# Patient Record
Sex: Male | Born: 1975 | Race: White | Hispanic: No | Marital: Single | Smoking: Current every day smoker
Health system: Southern US, Community
[De-identification: ages and names within clinical notes are randomized; demographics above are authoritative.]

## PROBLEM LIST (undated history)

## (undated) DIAGNOSIS — I872 Venous insufficiency (chronic) (peripheral): Secondary | ICD-10-CM

## (undated) DIAGNOSIS — I10 Essential (primary) hypertension: Secondary | ICD-10-CM

## (undated) HISTORY — DX: Essential (primary) hypertension: I10

---

## 2008-03-09 ENCOUNTER — Ambulatory Visit: Payer: Self-pay | Admitting: Family Medicine

## 2012-06-07 ENCOUNTER — Emergency Department: Payer: Self-pay | Admitting: Emergency Medicine

## 2015-03-18 ENCOUNTER — Emergency Department: Payer: Self-pay | Admitting: Emergency Medicine
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
## 2021-08-16 DIAGNOSIS — Z20822 Contact with and (suspected) exposure to covid-19: Secondary | ICD-10-CM | POA: Diagnosis not present

## 2021-09-07 DIAGNOSIS — Z79899 Other long term (current) drug therapy: Secondary | ICD-10-CM | POA: Diagnosis not present

## 2021-09-07 DIAGNOSIS — F1120 Opioid dependence, uncomplicated: Secondary | ICD-10-CM | POA: Diagnosis not present

## 2021-09-07 DIAGNOSIS — Z1389 Encounter for screening for other disorder: Secondary | ICD-10-CM | POA: Diagnosis not present

## 2021-09-15 DIAGNOSIS — F1120 Opioid dependence, uncomplicated: Secondary | ICD-10-CM | POA: Diagnosis not present

## 2021-09-29 DIAGNOSIS — Z1389 Encounter for screening for other disorder: Secondary | ICD-10-CM | POA: Diagnosis not present

## 2021-09-29 DIAGNOSIS — F17290 Nicotine dependence, other tobacco product, uncomplicated: Secondary | ICD-10-CM | POA: Diagnosis not present

## 2021-09-29 DIAGNOSIS — F1120 Opioid dependence, uncomplicated: Secondary | ICD-10-CM | POA: Diagnosis not present

## 2021-09-29 DIAGNOSIS — Z79899 Other long term (current) drug therapy: Secondary | ICD-10-CM | POA: Diagnosis not present

## 2021-10-27 DIAGNOSIS — Z79899 Other long term (current) drug therapy: Secondary | ICD-10-CM | POA: Diagnosis not present

## 2021-10-27 DIAGNOSIS — Z1389 Encounter for screening for other disorder: Secondary | ICD-10-CM | POA: Diagnosis not present

## 2021-10-27 DIAGNOSIS — F1120 Opioid dependence, uncomplicated: Secondary | ICD-10-CM | POA: Diagnosis not present

## 2021-11-24 DIAGNOSIS — Z79899 Other long term (current) drug therapy: Secondary | ICD-10-CM | POA: Diagnosis not present

## 2021-11-24 DIAGNOSIS — F1120 Opioid dependence, uncomplicated: Secondary | ICD-10-CM | POA: Diagnosis not present

## 2021-12-07 DIAGNOSIS — F1120 Opioid dependence, uncomplicated: Secondary | ICD-10-CM | POA: Diagnosis not present

## 2021-12-07 DIAGNOSIS — Z1389 Encounter for screening for other disorder: Secondary | ICD-10-CM | POA: Diagnosis not present

## 2021-12-07 DIAGNOSIS — Z79899 Other long term (current) drug therapy: Secondary | ICD-10-CM | POA: Diagnosis not present

## 2021-12-26 ENCOUNTER — Encounter: Payer: Self-pay | Admitting: Family Medicine

## 2021-12-26 ENCOUNTER — Other Ambulatory Visit: Payer: Self-pay

## 2021-12-26 ENCOUNTER — Ambulatory Visit (INDEPENDENT_AMBULATORY_CARE_PROVIDER_SITE_OTHER): Payer: BC Managed Care – PPO | Admitting: Family Medicine

## 2021-12-26 VITALS — BP 147/84 | HR 89 | Ht 68.0 in | Wt 318.6 lb

## 2021-12-26 DIAGNOSIS — Z6841 Body Mass Index (BMI) 40.0 and over, adult: Secondary | ICD-10-CM

## 2021-12-26 DIAGNOSIS — I10 Essential (primary) hypertension: Secondary | ICD-10-CM | POA: Insufficient documentation

## 2021-12-26 DIAGNOSIS — Z7689 Persons encountering health services in other specified circumstances: Secondary | ICD-10-CM | POA: Diagnosis not present

## 2021-12-26 LAB — BASIC METABOLIC PANEL WITH GFR
BUN: 14 mg/dL (ref 7–25)
CO2: 32 mmol/L (ref 20–32)
Calcium: 9.2 mg/dL (ref 8.6–10.3)
Chloride: 104 mmol/L (ref 98–110)
Creat: 0.88 mg/dL (ref 0.60–1.29)
Glucose, Bld: 94 mg/dL (ref 65–139)
Potassium: 4.7 mmol/L (ref 3.5–5.3)
Sodium: 140 mmol/L (ref 135–146)
eGFR: 108 mL/min/{1.73_m2} (ref >=60)

## 2021-12-26 MED ORDER — AMLODIPINE BESYLATE 5 MG PO TABS: 5.0000 mg | ORAL_TABLET | Freq: Every day | ORAL | 2 refills | Status: DC

## 2021-12-26 NOTE — Patient Instructions (Addendum)
Thank you for coming to the office today.   Check food labels for sodium % - look for low sodium options.  Reduce the Soda down from 3 per day down to 1. Increase water intake.  Start with Amlodipine blood pressure pill 50m daily. Can cause some swelling in feet ankles legs, try to keep elevating them to help reduce.   Lab today for basic chemistry  Plan for future physical wellness with full labs in Summer 2023  Colon Cancer Screening: - For all adults age 46+routine colon cancer screening is highly recommended.     - Recent guidelines from [place] starting age of 495- Early detection of colon cancer is important, because often there are no warning signs or symptoms, also if found early usually it can be cured. Late stage is hard to treat.  - If you are not interested in Colonoscopy screening (if done and normal you could be cleared for 5 to 10 years until next due), then Cologuard is an excellent alternative for screening test for Colon Cancer. It is highly sensitive for detecting DNA of colon cancer from even the earliest stages. Also, there is NO bowel prep required. - If Cologuard is NEGATIVE, then it is good for 3 years before next due - If Cologuard is POSITIVE, then it is strongly advised to get a Colonoscopy, which allows the GI doctor to locate the source of the cancer or polyp (even very early stage) and treat it by removing it. ------------------------- If you would like to proceed with Cologuard (stool DNA test) - FIRST, call your insurance company and tell them you want to check cost of Cologuard tell them CPT Code [phone](it may be completely covered and you could get for no cost, OR max cost without any coverage is about $600). Also, keep in mind if you do NOT open the kit, and decide not to do the test, you will NOT be charged, you should contact the company if you decide not to do the test. - If you want to proceed, you can notify [place](phone message,  [place] or at next visit) and we will order it for you. The test kit will be delivered to you house within about 1 week. Follow instructions to collect sample, you may call the company for any help or questions, 24/7 telephone support at [phone]    Please schedule a Follow-up Appointment to: Return in about 6 weeks (around 02/06/2022) for 6 weeks follow-up Blood pressure HTN med.  If you have any other questions or concerns, please feel free to call the office or send a message through [place] You may also schedule an earlier appointment if necessary.  Additionally, you may be receiving a survey about your experience at our office within a few days to 1 week by e-mail or mail. We value your feedback.  ANobie Putnam DO [facility] DASH Eating Plan DASH stands for Dietary Approaches to Stop Hypertension. The DASH eating plan is a healthy eating plan that has been shown to: Reduce high blood pressure (hypertension). Reduce your risk for type 2 diabetes, heart disease, and stroke. Help with weight loss. What are tips for following this plan? Reading food labels Check food labels for the amount of salt (sodium) per serving. Choose foods with less than 5 percent of the Daily Value of sodium. Generally, foods with less than 300 milligrams (mg) of sodium per serving fit into this eating plan. To find  whole grains, look for the word "whole" as the first word in the ingredient list. Shopping Buy products labeled as "low-sodium" or "no salt added." Buy fresh foods. Avoid canned foods and pre-made or frozen meals. Cooking Avoid adding salt when cooking. Use salt-free seasonings or herbs instead of table salt or sea salt. Check with your health care provider or pharmacist before using salt substitutes. Do not fry foods. Cook foods using healthy methods such as baking, boiling, grilling, roasting, and broiling instead. Cook with heart-healthy oils, such  as olive, canola, avocado, soybean, or sunflower oil. Meal planning  Eat a balanced diet that includes: 4 or more servings of fruits and 4 or more servings of vegetables each day. Try to fill one-half of your plate with fruits and vegetables. 6-8 servings of whole grains each day. Less than 6 oz (170 g) of lean meat, poultry, or fish each day. A 3-oz (85-g) serving of meat is about the same size as a deck of cards. One egg equals 1 oz (28 g). 2-3 servings of low-fat dairy each day. One serving is 1 cup (237 mL). 1 serving of nuts, seeds, or beans 5 times each week. 2-3 servings of heart-healthy fats. Healthy fats called omega-3 fatty acids are found in foods such as walnuts, flaxseeds, fortified milks, and eggs. These fats are also found in cold-water fish, such as sardines, salmon, and mackerel. Limit how much you eat of: Canned or prepackaged foods. Food that is high in trans fat, such as some fried foods. Food that is high in saturated fat, such as fatty meat. Desserts and other sweets, sugary drinks, and other foods with added sugar. Full-fat dairy products. Do not salt foods before eating. Do not eat more than 4 egg yolks a week. Try to eat at least 2 vegetarian meals a week. Eat more home-cooked food and less restaurant, buffet, and fast food. Lifestyle When eating at a restaurant, ask that your food be prepared with less salt or no salt, if possible. If you drink alcohol: Limit how much you use to: 0-1 drink a day for women who are not pregnant. 0-2 drinks a day for men. Be aware of how much alcohol is in your drink. In the [place] one drink equals one 12 oz bottle of beer (355 mL), one 5 oz glass of wine (148 mL), or one 1 oz glass of hard liquor (44 mL). General information Avoid eating more than 2,300 mg of salt a day. If you have hypertension, you may need to reduce your sodium intake to 1,500 mg a day. Work with your health care provider to maintain a healthy body weight or to  lose weight. Ask what an ideal weight is for you. Get at least 30 minutes of exercise that causes your heart to beat faster (aerobic exercise) most days of the week. Activities may include walking, swimming, or biking. Work with your health care provider or dietitian to adjust your eating plan to your individual calorie needs. What foods should I eat? Fruits All fresh, dried, or frozen fruit. Canned fruit in natural juice (without added sugar). Vegetables Fresh or frozen vegetables (raw, steamed, roasted, or grilled). Low-sodium or reduced-sodium tomato and vegetable juice. Low-sodium or reduced-sodium tomato sauce and tomato paste. Low-sodium or reduced-sodium canned vegetables. Grains Whole-grain or whole-wheat bread. Whole-grain or whole-wheat pasta. Brown rice. Brad [name]. Bulgur. Whole-grain and low-sodium cereals. Pita bread. Low-fat, low-sodium crackers. Whole-wheat flour tortillas. Meats and other proteins Skinless chicken or [place]. Ground chicken or [place].  Pork with fat trimmed off. Fish and seafood. Egg whites. Dried beans, peas, or lentils. Unsalted nuts, nut butters, and seeds. Unsalted canned beans. Lean cuts of beef with fat trimmed off. Low-sodium, lean precooked or cured meat, such as sausages or meat loaves. Dairy Low-fat (1%) or fat-free (skim) milk. Reduced-fat, low-fat, or fat-free cheeses. Nonfat, low-sodium ricotta or cottage cheese. Low-fat or nonfat yogurt. Low-fat, low-sodium cheese. Fats and oils Soft margarine without trans fats. Vegetable oil. Reduced-fat, low-fat, or light mayonnaise and salad dressings (reduced-sodium). Canola, safflower, olive, avocado, soybean, and sunflower oils. Avocado. Seasonings and condiments Herbs. Spices. Seasoning mixes without salt. Other foods Unsalted popcorn and pretzels. Fat-free sweets. The items listed above may not be a complete list of foods and beverages you can eat. Contact a dietitian for more information. What foods  should I avoid? Fruits Canned fruit in a light or heavy syrup. Fried fruit. Fruit in cream or butter sauce. Vegetables Creamed or fried vegetables. Vegetables in a cheese sauce. Regular canned vegetables (not low-sodium or reduced-sodium). Regular canned tomato sauce and paste (not low-sodium or reduced-sodium). Regular tomato and vegetable juice (not low-sodium or reduced-sodium). Brad [name]. Olives. Grains Baked goods made with fat, such as croissants, muffins, or some breads. Dry pasta or rice meal packs. Meats and other proteins Fatty cuts of meat. Ribs. Fried meat. Brad [name]. Bologna, salami, and other precooked or cured meats, such as sausages or meat loaves. Fat from the back of a pig (fatback). Bratwurst. Salted nuts and seeds. Canned beans with added salt. Canned or smoked fish. Whole eggs or egg yolks. Chicken or [place] with skin. Dairy Whole or 2% milk, cream, and half-and-half. Whole or full-fat cream cheese. Whole-fat or sweetened yogurt. Full-fat cheese. Nondairy creamers. Whipped toppings. Processed cheese and cheese spreads. Fats and oils Butter. Stick margarine. Lard. Shortening. Ghee. Bacon fat. Tropical oils, such as coconut, palm kernel, or palm oil. Seasonings and condiments Onion salt, garlic salt, seasoned salt, table salt, and sea salt. Worcestershire sauce. Tartar sauce. Barbecue sauce. Teriyaki sauce. Soy sauce, including reduced-sodium. Steak sauce. Canned and packaged gravies. Fish sauce. Oyster sauce. Cocktail sauce. Store-bought horseradish. Ketchup. Mustard. Meat flavorings and tenderizers. Bouillon cubes. Hot sauces. Pre-made or packaged marinades. Pre-made or packaged taco seasonings. Relishes. Regular salad dressings. Other foods Salted popcorn and pretzels. The items listed above may not be a complete list of foods and beverages you should avoid. Contact a dietitian for more information. Where to find more information National Heart, Lung, and Blood Institute:  https://wilson-eaton.com/ American Heart Association: www.heart.org Academy of Nutrition and Dietetics: www.eatright.[place]: www.kidney.org Summary The DASH eating plan is a healthy eating plan that has been shown to reduce high blood pressure (hypertension). It may also reduce your risk for type 2 diabetes, heart disease, and stroke. When on the DASH eating plan, aim to eat more fresh fruits and vegetables, whole grains, lean proteins, low-fat dairy, and heart-healthy fats. With the DASH eating plan, you should limit salt (sodium) intake to 2,300 mg a day. If you have hypertension, you may need to reduce your sodium intake to 1,500 mg a day. Work with your health care provider or dietitian to adjust your eating plan to your individual calorie needs. This information is not intended to replace advice given to you by your health care provider. Make sure you discuss any questions you have with your health care provider. Document Revised: 11/13/2019 Document Reviewed: 11/13/2019 Elsevier Patient Education  2022 [employer].

## 2021-12-26 NOTE — Progress Notes (Signed)
Subjective:    Patient ID: Brad [name], male    DOB: 1976-08-05, 46 y.o.   MRN: [id number]  Brad [name] is a 46 y.o. male presenting on 12/26/2021 for Establish Care and Hypertension   HPI  CHRONIC HTN Morbid Obesity BMI >48 Reports history of HTN, not established with prior doctor, has not been on medication.  Current Meds - None never on before. Lifestyle: - Diet: Increased fruit for breakfast, admits limited time during lunch usually convenience or fast foods. Admits drinks frequent soda 3 x 20 oz Admits occasional edema in legs, if improved w/ active and if sleeping in bed it improve Denies CP, dyspnea, HA, dizziness / lightheadedness   Health Maintenance:  Due for Colon / Prostate CA Screening.  Depression screen PHQ 2/9 12/26/2021  Decreased Interest 0  Down, Depressed, Hopeless 0  PHQ - 2 Score 0    Past Medical History:  Diagnosis Date   Hypertension    History reviewed. No pertinent surgical history. Social History   Socioeconomic History   Marital status: Single    Spouse name: Not on file   Number of children: Not on file   Years of education: Not on file   Highest education level: Not on file  Occupational History   Not on file  Tobacco Use   Smoking status: Some Days    Packs/day: 1.00    Years: 27.00    Pack years: 27.00    Types: Cigarettes   Smokeless tobacco: Never  Vaping Use   Vaping Use: Never used  Substance and Sexual Activity   Alcohol use: Yes    Alcohol/week: 1.0 - 2.0 standard drink    Types: 1 - 2 Standard drinks or equivalent per week   Drug use: Yes    Types: Marijuana   Sexual activity: Not on file  Other Topics Concern   Not on file  Social History Narrative   Not on file   Social Determinants of Health   Financial Resource Strain: Not on file  Food Insecurity: Not on file  Transportation Needs: Not on file  Physical Activity: Not on file  Stress: Not on file  Social Connections: Not on file   Intimate Partner Violence: Not on file   Family History  Problem Relation Age of Onset   Leukemia Maternal Grandmother    Stroke Maternal Grandfather    No current outpatient medications on file prior to visit.   No current facility-administered medications on file prior to visit.    Review of Systems Per HPI unless specifically indicated above     Objective:    BP (!) 147/84    Pulse 89    Ht 5\' 8"  (1.727 m)    Wt (!) 318 lb 9.6 oz (144.5 kg)    SpO2 98%    BMI 48.44 kg/m   Wt Readings from Last 3 Encounters:  12/26/21 (!) 318 lb 9.6 oz (144.5 kg)    Physical Exam Vitals and nursing note reviewed.  Constitutional:      General: He is not in acute distress.    Appearance: He is well-developed. He is not diaphoretic.     Comments: Well-appearing, comfortable, cooperative  HENT:     Head: Normocephalic and atraumatic.  Eyes:     General:        Right eye: No discharge.        Left eye: No discharge.     Conjunctiva/sclera: Conjunctivae normal.  Neck:  Thyroid: No thyromegaly.  Cardiovascular:     Rate and Rhythm: Normal rate and regular rhythm.     Pulses: Normal pulses.     Heart sounds: Normal heart sounds. No murmur heard. Pulmonary:     Effort: Pulmonary effort is normal. No respiratory distress.     Breath sounds: Normal breath sounds. No wheezing or rales.  Musculoskeletal:        General: Normal range of motion.     Cervical back: Normal range of motion and neck supple.  Lymphadenopathy:     Cervical: No cervical adenopathy.  Skin:    General: Skin is warm and dry.     Findings: No erythema or rash.  Neurological:     Mental Status: He is alert and oriented to person, place, and time. Mental status is at baseline.  Psychiatric:        Behavior: Behavior normal.     Comments: Well groomed, good eye contact, normal speech and thoughts   No results found for this or any previous visit.    Assessment & Plan:   Problem List Items Addressed This  Visit     Morbid obesity with BMI of 45.0-49.9, adult ([place])   Essential hypertension - Primary    Mildly elevated initial BP, prior history uncontrolled HTN No prior treatments - Home BP readings / mild elevated  No known complications    Plan:  1. Start rx Amlodipine 5mg  daily, review benefits potential side effect dosing 2. Encourage improved lifestyle - low sodium diet, regular exercise 3. Continue monitor BP outside office, bring readings to next visit, if persistently >140/90 or new symptoms notify office sooner  Labs today Follow-up 6 weeks      Relevant Medications   amLODipine (NORVASC) 5 MG tablet   Other Relevant Orders   BASIC METABOLIC PANEL WITH GFR   Other Visit Diagnoses     Encounter to establish care with new doctor           Meds ordered this encounter  Medications   amLODipine (NORVASC) 5 MG tablet    Sig: Take 1 tablet (5 mg total) by mouth daily.    Dispense:  30 tablet    Refill:  2   Lab today for basic chemistry  Plan for future physical wellness with full labs in Summer 2023  [place] handout   Follow up plan: Return in about 6 weeks (around 02/06/2022) for 6 weeks follow-up Blood pressure HTN med.  Nobie Putnam, [facility] 12/26/2021, 10:44 AM

## 2021-12-26 NOTE — Assessment & Plan Note (Signed)
Mildly elevated initial BP, prior history uncontrolled HTN No prior treatments - Home BP readings / mild elevated  No known complications    Plan:  1. Start rx Amlodipine 5mg  daily, review benefits potential side effect dosing 2. Encourage improved lifestyle - low sodium diet, regular exercise 3. Continue monitor BP outside office, bring readings to next visit, if persistently >140/90 or new symptoms notify office sooner  Labs today Follow-up 6 weeks

## 2022-01-04 DIAGNOSIS — Z79899 Other long term (current) drug therapy: Secondary | ICD-10-CM | POA: Diagnosis not present

## 2022-01-04 DIAGNOSIS — F909 Attention-deficit hyperactivity disorder, unspecified type: Secondary | ICD-10-CM | POA: Diagnosis not present

## 2022-01-04 DIAGNOSIS — F1120 Opioid dependence, uncomplicated: Secondary | ICD-10-CM | POA: Diagnosis not present

## 2022-02-01 DIAGNOSIS — F909 Attention-deficit hyperactivity disorder, unspecified type: Secondary | ICD-10-CM | POA: Diagnosis not present

## 2022-02-01 DIAGNOSIS — Z79899 Other long term (current) drug therapy: Secondary | ICD-10-CM | POA: Diagnosis not present

## 2022-02-01 DIAGNOSIS — Z1389 Encounter for screening for other disorder: Secondary | ICD-10-CM | POA: Diagnosis not present

## 2022-02-01 DIAGNOSIS — F1120 Opioid dependence, uncomplicated: Secondary | ICD-10-CM | POA: Diagnosis not present

## 2022-02-13 ENCOUNTER — Other Ambulatory Visit: Payer: Self-pay

## 2022-02-13 ENCOUNTER — Ambulatory Visit (INDEPENDENT_AMBULATORY_CARE_PROVIDER_SITE_OTHER): Payer: BC Managed Care – PPO | Admitting: Family Medicine

## 2022-02-13 VITALS — BP 160/86 | HR 88 | Ht 68.0 in | Wt 329.4 lb

## 2022-02-13 DIAGNOSIS — Z6841 Body Mass Index (BMI) 40.0 and over, adult: Secondary | ICD-10-CM

## 2022-02-13 DIAGNOSIS — I872 Venous insufficiency (chronic) (peripheral): Secondary | ICD-10-CM | POA: Insufficient documentation

## 2022-02-13 DIAGNOSIS — Z1211 Encounter for screening for malignant neoplasm of colon: Secondary | ICD-10-CM

## 2022-02-13 DIAGNOSIS — L989 Disorder of the skin and subcutaneous tissue, unspecified: Secondary | ICD-10-CM

## 2022-02-13 DIAGNOSIS — I10 Essential (primary) hypertension: Secondary | ICD-10-CM | POA: Diagnosis not present

## 2022-02-13 DIAGNOSIS — R600 Localized edema: Secondary | ICD-10-CM

## 2022-02-13 MED ORDER — FUROSEMIDE 20 MG PO TABS: 20.0000 mg | ORAL_TABLET | Freq: Every day | ORAL | 2 refills | Status: DC | PRN

## 2022-02-13 MED ORDER — HYDROCHLOROTHIAZIDE 25 MG PO TABS: 25.0000 mg | ORAL_TABLET | Freq: Every day | ORAL | 3 refills | Status: DC

## 2022-02-13 NOTE — Assessment & Plan Note (Signed)
Still elevated BP did not take med today  No home readings Side effect leg swelling worse w/ Amlodipine   Plan:  1. DC Amlodipine 5mg  2. START HCTZ 25mg  daily - Add Furosemide Lasix 20mg  daily PRN ONLY caution with too much use 3. Encourage improved lifestyle - low sodium diet, regular exercise 4. Continue monitor BP outside office, bring readings to next visit, if persistently >140/90 or new symptoms notify office sooner

## 2022-02-13 NOTE — Patient Instructions (Addendum)
Thank you for coming to the office today. ° °Stop Amlodipine °Start Hydrochlorothiazide HCTZ 25mg daily (fluid pill) ° °ONLY IF SWELLING is significant take Lasix (Furosemide) 20mg daily ONLY as needed for swelling can take for a few days in a row 3-5 then stop immediately. ° °Ordered the Cologuard (home kit) test for colon cancer screening. Stay tuned for further updates. ° °It will be shipped to you directly. If not received in 2-4 weeks, call us or the company. °  °If you send it back and no results are received in 2-4 weeks, call us or the company as well! °  °Colon Cancer Screening: °- For all adults age 50+ routine colon cancer screening is highly recommended. °    - Recent guidelines from American Cancer Society recommend starting age of 45 °- Early detection of colon cancer is important, because often there are no warning signs or symptoms, also if found early usually it can be cured. Late stage is hard to treat. °  °- If Cologuard is NEGATIVE, then it is good for 3 years before next due °- If Cologuard is POSITIVE, then it is strongly advised to get a Colonoscopy, which allows the GI doctor to locate the source of the cancer or polyp (even very early stage) and treat it by removing it. °------------------------- °Follow instructions to collect sample, you may call the company for any help or questions, 24/7 telephone support at [phone]. ° ° °Use RICE therapy: °- R - Rest / relative rest with activity modification avoid overuse of joint °- I - Ice packs (make sure you use a towel or sock / something to protect skin) °- C - Compression with stockings / ACE wrap to apply pressure and reduce swelling allowing more support °- E - Elevation - if significant swelling, lift leg above heart level (toes above your nose) to help reduce swelling, most helpful at night after day of being on your feet ° ° ° °Please schedule a Follow-up Appointment to: Return in about 3 months (around 05/13/2022) for 3 month  follow-up HTN, Edema. ° °If you have any other questions or concerns, please feel free to call the office or send a message through MyChart. You may also schedule an earlier appointment if necessary. ° °Additionally, you may be receiving a survey about your experience at our office within a few days to 1 week by e-mail or mail. We value your feedback. ° °Alexander Karamalegos, DO °[facility] °

## 2022-02-13 NOTE — Assessment & Plan Note (Signed)
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

## 2022-02-13 NOTE — Progress Notes (Signed)
Subjective:    Patient ID: Brad [name], male    DOB: Nov 07, 1976, 46 y.o.   MRN: [id number]  Brad [name] is a 46 y.o. male presenting on 02/13/2022 for Hypertension   HPI  CHRONIC HTN Morbid Obesity BMI >50 Venous Insufficiency ./ LE Edema Last visit 12/26/21 - started Amlodipine 27m daily but he had side effect of leg swelling. Not checking BP regularly at home, no access to cuff. No other readings. He said past few weeks leg swelling increased notably on medication Current Meds - Amlodipine 538mdaily Did NOT take BP med today Lifestyle: - Diet: improving, he has reduced sodas, not improving sodium as much Admits occasional edema in legs, if improved w/ active and if sleeping in bed it improve Denies CP, dyspnea, HA, dizziness / lightheadedness   [facility] 02/13/2022 12/26/2021  Decreased Interest 1 0  Down, Depressed, Hopeless 1 0  PHQ - 2 Score 2 0  Altered sleeping 2 -  Tired, decreased energy 2 -  Change in appetite 2 -  Feeling bad or failure about yourself  1 -  Trouble concentrating 2 -  Moving slowly or fidgety/restless 2 -  PHQ-9 Score 13 -  Difficult doing work/chores Somewhat difficult -    Social History   Tobacco Use   Smoking status: Some Days    Packs/day: 1.00    Years: 27.00    Pack years: 27.00    Types: Cigarettes   Smokeless tobacco: Never  Vaping Use   Vaping Use: Never used  Substance Use Topics   Alcohol use: Yes    Alcohol/week: 1.0 - 2.0 standard drink    Types: 1 - 2 Standard drinks or equivalent per week   Drug use: Yes    Types: Marijuana    Review of Systems Per HPI unless specifically indicated above     Objective:    BP (!) 160/86    Pulse 88    Ht 5' 8"  (1.727 m)    Wt (!) 329 lb 6.4 oz (149.4 kg)    SpO2 97%    BMI 50.09 kg/m   Wt Readings from Last 3 Encounters:  02/13/22 (!) 329 lb 6.4 oz (149.4 kg)  12/26/21 (!) 318 lb 9.6 oz (144.5 kg)    Physical Exam Vitals and nursing note  reviewed.  Constitutional:      General: He is not in acute distress.    Appearance: He is well-developed. He is obese. He is not diaphoretic.     Comments: Well-appearing, comfortable, cooperative  HENT:     Head: Normocephalic and atraumatic.  Eyes:     General:        Right eye: No discharge.        Left eye: No discharge.     Conjunctiva/sclera: Conjunctivae normal.  Neck:     Thyroid: No thyromegaly.  Cardiovascular:     Rate and Rhythm: Normal rate and regular rhythm.     Pulses: Normal pulses.     Heart sounds: Normal heart sounds. No murmur heard. Pulmonary:     Effort: Pulmonary effort is normal. No respiratory distress.     Breath sounds: Normal breath sounds. No wheezing or rales.  Musculoskeletal:        General: Normal range of motion.     Cervical back: Normal range of motion and neck supple.     Right lower leg: Edema (+3 pitting edema, some erythema) present.     Left  lower leg: Edema present.  Lymphadenopathy:     Cervical: No cervical adenopathy.  Skin:    General: Skin is warm and dry.     Findings: Lesion (R neck lateral 1 x 1 cm ulcerated skin growth) present. No erythema or rash.  Neurological:     Mental Status: He is alert and oriented to person, place, and time. Mental status is at baseline.  Psychiatric:        Behavior: Behavior normal.     Comments: Well groomed, good eye contact, normal speech and thoughts     Results for orders placed or performed in visit on 01/01/31  BASIC METABOLIC PANEL WITH GFR  Result Value Ref Range   Glucose, Bld 94 65 - 139 mg/dL   BUN 14 7 - 25 mg/dL   Creat 0.88 0.60 - 1.29 mg/dL   eGFR 108 > OR = 60 mL/min/1.51m   BUN/Creatinine Ratio NOT APPLICABLE 6 - 22 (calc)   Sodium 140 135 - 146 mmol/L   Potassium 4.7 3.5 - 5.3 mmol/L   Chloride 104 98 - 110 mmol/L   CO2 32 20 - 32 mmol/L   Calcium 9.2 8.6 - 10.3 mg/dL      Assessment & Plan:   Problem List Items Addressed This Visit     Venous insufficiency  of both lower extremities    Likely etiology of chronic b/l lower ext swelling Worse with Amlodipine, DC now Start Thiazide Furosemide PRN RICE therapy      Relevant Medications   hydrochlorothiazide (HYDRODIURIL) 25 MG tablet   furosemide (LASIX) 20 MG tablet   Morbid obesity with BMI of 50.0-59.9, adult (HCC)    Lifestyle counseling today Goal for diet exercise management Diuretic therapy for water weight Future consider medication or options      Relevant Medications   amphetamine-dextroamphetamine (ADDERALL) 10 MG tablet   Essential hypertension - Primary    Still elevated BP did not take med today  No home readings Side effect leg swelling worse w/ Amlodipine   Plan:  1. DC Amlodipine 564m2. START HCTZ 2574maily - Add Furosemide Lasix 22m27mily PRN ONLY caution with too much use 3. Encourage improved lifestyle - low sodium diet, regular exercise 4. Continue monitor BP outside office, bring readings to next visit, if persistently >140/90 or new symptoms notify office sooner      Relevant Medications   hydrochlorothiazide (HYDRODIURIL) 25 MG tablet   furosemide (LASIX) 20 MG tablet   Other Visit Diagnoses     Screening for colon cancer       Relevant Orders   Cologuard   Non-healing skin lesion       Relevant Orders   Ambulatory referral to Dermatology   Bilateral lower extremity edema       Relevant Medications   furosemide (LASIX) 20 MG tablet   Venous stasis dermatitis of both lower extremities           Ordered Cologuard  Refer to Dermatology for R neck non healing skin growth  Orders Placed This Encounter  Procedures   Cologuard   Ambulatory referral to Dermatology    Referral Priority:   Routine    Referral Type:   Consultation    Referral Reason:   Specialty Services Required    Requested Specialty:   Dermatology    Number of Visits Requested:   1       Meds ordered this encounter  Medications   hydrochlorothiazide (HYDRODIURIL) 25  MG tablet  Sig: Take 1 tablet (25 mg total) by mouth daily.    Dispense:  90 tablet    Refill:  3   furosemide (LASIX) 20 MG tablet    Sig: Take 1 tablet (20 mg total) by mouth daily as needed for fluid or edema.    Dispense:  30 tablet    Refill:  2     Follow up plan: Return in about 3 months (around 05/13/2022) for 3 month follow-up HTN, Edema.  Nobie Putnam, [facility] 02/13/2022, 3:55 PM

## 2022-02-13 NOTE — Assessment & Plan Note (Signed)
Lifestyle counseling today Goal for diet exercise management Diuretic therapy for water weight Future consider medication or options

## 2022-03-01 DIAGNOSIS — F909 Attention-deficit hyperactivity disorder, unspecified type: Secondary | ICD-10-CM | POA: Diagnosis not present

## 2022-03-01 DIAGNOSIS — F1120 Opioid dependence, uncomplicated: Secondary | ICD-10-CM | POA: Diagnosis not present

## 2022-03-01 DIAGNOSIS — Z79899 Other long term (current) drug therapy: Secondary | ICD-10-CM | POA: Diagnosis not present

## 2022-03-24 ENCOUNTER — Emergency Department: Payer: BC Managed Care – PPO

## 2022-03-24 ENCOUNTER — Encounter: Payer: Self-pay | Admitting: Emergency Medicine

## 2022-03-24 ENCOUNTER — Other Ambulatory Visit: Payer: Self-pay

## 2022-03-24 ENCOUNTER — Inpatient Hospital Stay
Admission: EM | Admit: 2022-03-24 | Discharge: 2022-03-31 | DRG: 872 | Disposition: A | Discharge status: Home / Self Care | Payer: BC Managed Care – PPO | Attending: Internal Medicine | Admitting: Internal Medicine

## 2022-03-24 ENCOUNTER — Inpatient Hospital Stay: Payer: BC Managed Care – PPO

## 2022-03-24 DIAGNOSIS — T502X5A Adverse effect of carbonic-anhydrase inhibitors, benzothiadiazides and other diuretics, initial encounter: Secondary | ICD-10-CM | POA: Diagnosis not present

## 2022-03-24 DIAGNOSIS — Z20822 Contact with and (suspected) exposure to covid-19: Secondary | ICD-10-CM | POA: Diagnosis not present

## 2022-03-24 DIAGNOSIS — M79606 Pain in leg, unspecified: Secondary | ICD-10-CM | POA: Diagnosis not present

## 2022-03-24 DIAGNOSIS — M79604 Pain in right leg: Secondary | ICD-10-CM | POA: Diagnosis not present

## 2022-03-24 DIAGNOSIS — L03115 Cellulitis of right lower limb: Secondary | ICD-10-CM | POA: Diagnosis present

## 2022-03-24 DIAGNOSIS — R600 Localized edema: Secondary | ICD-10-CM | POA: Diagnosis present

## 2022-03-24 DIAGNOSIS — N179 Acute kidney failure, unspecified: Secondary | ICD-10-CM | POA: Diagnosis present

## 2022-03-24 DIAGNOSIS — L03119 Cellulitis of unspecified part of limb: Secondary | ICD-10-CM

## 2022-03-24 DIAGNOSIS — A419 Sepsis, unspecified organism: Secondary | ICD-10-CM | POA: Diagnosis not present

## 2022-03-24 DIAGNOSIS — R609 Edema, unspecified: Secondary | ICD-10-CM

## 2022-03-24 DIAGNOSIS — R0602 Shortness of breath: Secondary | ICD-10-CM | POA: Diagnosis not present

## 2022-03-24 DIAGNOSIS — F17210 Nicotine dependence, cigarettes, uncomplicated: Secondary | ICD-10-CM | POA: Diagnosis present

## 2022-03-24 DIAGNOSIS — Z79899 Other long term (current) drug therapy: Secondary | ICD-10-CM | POA: Diagnosis not present

## 2022-03-24 DIAGNOSIS — I872 Venous insufficiency (chronic) (peripheral): Secondary | ICD-10-CM | POA: Diagnosis not present

## 2022-03-24 DIAGNOSIS — Z6841 Body Mass Index (BMI) 40.0 and over, adult: Secondary | ICD-10-CM

## 2022-03-24 DIAGNOSIS — E860 Dehydration: Secondary | ICD-10-CM | POA: Diagnosis present

## 2022-03-24 DIAGNOSIS — L0390 Cellulitis, unspecified: Secondary | ICD-10-CM

## 2022-03-24 DIAGNOSIS — E876 Hypokalemia: Secondary | ICD-10-CM | POA: Diagnosis not present

## 2022-03-24 DIAGNOSIS — I330 Acute and subacute infective endocarditis: Secondary | ICD-10-CM | POA: Diagnosis not present

## 2022-03-24 DIAGNOSIS — R6520 Severe sepsis without septic shock: Secondary | ICD-10-CM | POA: Diagnosis not present

## 2022-03-24 DIAGNOSIS — M7989 Other specified soft tissue disorders: Secondary | ICD-10-CM | POA: Diagnosis present

## 2022-03-24 DIAGNOSIS — R000 Tachycardia, unspecified: Secondary | ICD-10-CM | POA: Diagnosis not present

## 2022-03-24 DIAGNOSIS — I10 Essential (primary) hypertension: Secondary | ICD-10-CM | POA: Diagnosis not present

## 2022-03-24 DIAGNOSIS — E872 Acidosis, unspecified: Secondary | ICD-10-CM | POA: Diagnosis not present

## 2022-03-24 HISTORY — DX: Venous insufficiency (chronic) (peripheral): I87.2

## 2022-03-24 LAB — URINALYSIS, COMPLETE (UACMP) WITH MICROSCOPIC
Bilirubin Urine: NEGATIVE
Glucose, UA: NEGATIVE mg/dL
Ketones, ur: NEGATIVE mg/dL
Leukocytes,Ua: NEGATIVE
Nitrite: NEGATIVE
Protein, ur: 100 mg/dL — AB
Specific Gravity, Urine: 1.029 (ref 1.005–1.030)
pH: 5 (ref 5.0–8.0)

## 2022-03-24 LAB — COMPREHENSIVE METABOLIC PANEL
ALT: 26 U/L (ref 0–44)
AST: 39 U/L (ref 15–41)
Albumin: 3.3 g/dL — ABNORMAL LOW (ref 3.5–5.0)
Alkaline Phosphatase: 45 U/L (ref 38–126)
Anion gap: 15 (ref 5–15)
BUN: 22 mg/dL — ABNORMAL HIGH (ref 6–20)
CO2: 27 mmol/L (ref 22–32)
Calcium: 8.4 mg/dL — ABNORMAL LOW (ref 8.9–10.3)
Chloride: 95 mmol/L — ABNORMAL LOW (ref 98–111)
Creatinine, Ser: 1.38 mg/dL — ABNORMAL HIGH (ref 0.61–1.24)
GFR, Estimated: >60 mL/min (ref >60)
Glucose, Bld: 134 mg/dL — ABNORMAL HIGH (ref 70–99)
Potassium: 3.2 mmol/L — ABNORMAL LOW (ref 3.5–5.1)
Sodium: 137 mmol/L (ref 135–145)
Total Bilirubin: 0.8 mg/dL (ref 0.3–1.2)
Total Protein: 8.1 g/dL (ref 6.5–8.1)

## 2022-03-24 LAB — BRAIN NATRIURETIC PEPTIDE: B Natriuretic Peptide: 67.8 pg/mL (ref 0.0–100.0)

## 2022-03-24 LAB — CBC WITH DIFFERENTIAL/PLATELET
Abs Immature Granulocytes: 0.2 10*3/uL — ABNORMAL HIGH (ref 0.00–0.07)
Basophils Absolute: 0 10*3/uL (ref 0.0–0.1)
Basophils Relative: 0 %
Eosinophils Absolute: 0.1 10*3/uL (ref 0.0–0.5)
Eosinophils Relative: 0 %
HCT: 47.8 % (ref 39.0–52.0)
Hemoglobin: 15.2 g/dL (ref 13.0–17.0)
Immature Granulocytes: 1 %
Lymphocytes Relative: 4 %
Lymphs Abs: 0.6 10*3/uL — ABNORMAL LOW (ref 0.7–4.0)
MCH: 26.5 pg (ref 26.0–34.0)
MCHC: 31.8 g/dL (ref 30.0–36.0)
MCV: 83.4 fL (ref 80.0–100.0)
Monocytes Absolute: 0.8 10*3/uL (ref 0.1–1.0)
Monocytes Relative: 5 %
Neutro Abs: 15 10*3/uL — ABNORMAL HIGH (ref 1.7–7.7)
Neutrophils Relative %: 90 %
Platelets: 242 10*3/uL (ref 150–400)
RBC: 5.73 MIL/uL (ref 4.22–5.81)
RDW: 14.3 % (ref 11.5–15.5)
WBC Morphology: INCREASED
WBC: 16.6 10*3/uL — ABNORMAL HIGH (ref 4.0–10.5)
nRBC: 0 % (ref 0.0–0.2)

## 2022-03-24 LAB — LIPASE, BLOOD: Lipase: 20 U/L (ref 11–51)

## 2022-03-24 LAB — APTT: aPTT: 40 seconds — ABNORMAL HIGH (ref 24–36)

## 2022-03-24 LAB — MAGNESIUM: Magnesium: 1.6 mg/dL — ABNORMAL LOW (ref 1.7–2.4)

## 2022-03-24 LAB — LACTIC ACID, PLASMA
Lactic Acid, Venous: 3.3 mmol/L (ref 0.5–1.9)
Lactic Acid, Venous: 2.7 mmol/L (ref 0.5–1.9)
Lactic Acid, Venous: 1.3 mmol/L (ref 0.5–1.9)

## 2022-03-24 LAB — RESP PANEL BY RT-PCR (FLU A&B, COVID) ARPGX2
Influenza A by PCR: NEGATIVE
Influenza B by PCR: NEGATIVE
SARS Coronavirus 2 by RT PCR: NEGATIVE

## 2022-03-24 LAB — PROTIME-INR
INR: 1.4 — ABNORMAL HIGH (ref 0.8–1.2)
Prothrombin Time: 17.4 seconds — ABNORMAL HIGH (ref 11.4–15.2)

## 2022-03-24 MED ORDER — ONDANSETRON HCL 4 MG PO TABS: 4.0000 mg | ORAL_TABLET | Freq: Four times a day (QID) | ORAL | Status: DC | PRN

## 2022-03-24 MED ORDER — IBUPROFEN 800 MG PO TABS
800.0000 mg | ORAL_TABLET | Freq: Once | ORAL | Status: AC
  Administered 2022-03-24: 800 mg via ORAL
  Filled 2022-03-24: qty 1

## 2022-03-24 MED ORDER — LACTATED RINGERS IV SOLN: INTRAVENOUS | Status: DC

## 2022-03-24 MED ORDER — LACTATED RINGERS IV BOLUS (SEPSIS)
1000.0000 mL | Freq: Once | INTRAVENOUS | Status: AC
  Administered 2022-03-24: 1000 mL via INTRAVENOUS

## 2022-03-24 MED ORDER — NICOTINE 14 MG/24HR TD PT24
14.0000 mg | MEDICATED_PATCH | Freq: Every day | TRANSDERMAL | Status: DC
Start: 2022-03-24 — End: 2022-03-31
  Administered 2022-03-24 – 2022-03-31 (×8): 14 mg via TRANSDERMAL
  Filled 2022-03-24 (×8): qty 1

## 2022-03-24 MED ORDER — POTASSIUM CHLORIDE CRYS ER 20 MEQ PO TBCR
40.0000 meq | EXTENDED_RELEASE_TABLET | Freq: Once | ORAL | Status: AC
  Administered 2022-03-24: 40 meq via ORAL
  Filled 2022-03-24: qty 2

## 2022-03-24 MED ORDER — ONDANSETRON HCL 4 MG/2ML IJ SOLN: 4.0000 mg | Freq: Four times a day (QID) | INTRAMUSCULAR | Status: DC | PRN

## 2022-03-24 MED ORDER — ACETAMINOPHEN 500 MG PO TABS
1000.0000 mg | ORAL_TABLET | Freq: Once | ORAL | Status: AC
  Administered 2022-03-24: 1000 mg via ORAL
  Filled 2022-03-24: qty 2

## 2022-03-24 MED ORDER — AMLODIPINE BESYLATE 5 MG PO TABS
5.0000 mg | ORAL_TABLET | Freq: Every day | ORAL | Status: DC
  Administered 2022-03-24 – 2022-03-25 (×2): 5 mg via ORAL
  Filled 2022-03-24 (×2): qty 1

## 2022-03-24 MED ORDER — LACTATED RINGERS IV BOLUS (SEPSIS)
200.0000 mL | Freq: Once | INTRAVENOUS | Status: AC
  Administered 2022-03-24: 200 mL via INTRAVENOUS

## 2022-03-24 MED ORDER — BUPRENORPHINE HCL-NALOXONE HCL 8-2 MG SL SUBL
1.0000 | SUBLINGUAL_TABLET | Freq: Every day | SUBLINGUAL | Status: DC
  Administered 2022-03-24 – 2022-03-31 (×8): 1 via SUBLINGUAL
  Filled 2022-03-24 (×8): qty 1

## 2022-03-24 MED ORDER — ENOXAPARIN SODIUM 80 MG/0.8ML IJ SOSY
0.5000 mg/kg | PREFILLED_SYRINGE | INTRAMUSCULAR | Status: DC
  Administered 2022-03-24 – 2022-03-30 (×7): 75 mg via SUBCUTANEOUS
  Filled 2022-03-24 (×7): qty 0.75

## 2022-03-24 MED ORDER — VANCOMYCIN HCL 500 MG/100ML IV SOLN
500.0000 mg | Freq: Once | INTRAVENOUS | Status: AC
  Administered 2022-03-24: 500 mg via INTRAVENOUS
  Filled 2022-03-24: qty 100

## 2022-03-24 MED ORDER — VANCOMYCIN HCL 2000 MG/400ML IV SOLN
2000.0000 mg | Freq: Once | INTRAVENOUS | Status: AC
  Administered 2022-03-24: 2000 mg via INTRAVENOUS
  Filled 2022-03-24: qty 400

## 2022-03-24 MED ORDER — ACETAMINOPHEN 325 MG PO TABS
650.0000 mg | ORAL_TABLET | Freq: Four times a day (QID) | ORAL | Status: DC | PRN
  Administered 2022-03-25 – 2022-03-30 (×2): 650 mg via ORAL
  Filled 2022-03-24 (×2): qty 2

## 2022-03-24 MED ORDER — AMPHETAMINE-DEXTROAMPHET ER 5 MG PO CP24
25.0000 mg | ORAL_CAPSULE | Freq: Every morning | ORAL | Status: DC
Start: 2022-03-24 — End: 2022-03-31
  Administered 2022-03-25 – 2022-03-31 (×7): 25 mg via ORAL
  Filled 2022-03-24 (×7): qty 5

## 2022-03-24 MED ORDER — SODIUM CHLORIDE 0.9 % IV SOLN: 2.0000 g | INTRAVENOUS | Status: DC

## 2022-03-24 MED ORDER — SODIUM CHLORIDE 0.9 % IV SOLN
2.0000 g | INTRAVENOUS | Status: DC
  Administered 2022-03-24 – 2022-03-25 (×2): 2 g via INTRAVENOUS
  Filled 2022-03-24 (×2): qty 20

## 2022-03-24 MED ORDER — ACETAMINOPHEN 650 MG RE SUPP: 650.0000 mg | Freq: Four times a day (QID) | RECTAL | Status: DC | PRN

## 2022-03-24 MED ORDER — MAGNESIUM SULFATE 2 GM/50ML IV SOLN
2.0000 g | Freq: Once | INTRAVENOUS | Status: AC
  Administered 2022-03-24: 2 g via INTRAVENOUS
  Filled 2022-03-24: qty 50

## 2022-03-24 NOTE — Progress Notes (Signed)
PHARMACIST - PHYSICIAN COMMUNICATION ? ?CONCERNING:  Enoxaparin (Lovenox) for DVT Prophylaxis  ? ? ?RECOMMENDATION: ?Patient was prescribed enoxaparin '40mg'$  q24 hours for VTE prophylaxis.  ? ?Filed Weights  ? 03/24/22 0102  ?Weight: (!) 149.7 kg (330 lb)  ? ? ?Body mass index is 50.18 kg/m?. ? ?Estimated Creatinine Clearance: 96.5 mL/min (A) (by C-G formula based on SCr of 1.38 mg/dL (H)). ? ? ?Based on [place] patient is candidate for enoxaparin 0.'5mg'$ /kg TBW SQ every 24 hours based on BMI being >30. ? ?DESCRIPTION: ?Pharmacy has adjusted enoxaparin dose per [facility] policy. ? ?Patient is now receiving enoxaparin 75 mg every 24 hours  ? ?Benita Gutter ?03/24/2022 ?9:57 AM  ?

## 2022-03-24 NOTE — ED Provider Notes (Signed)
? ?[facility] ?Provider Note ? ? ? Event Date/Time  ? First MD Initiated Contact with Patient 03/24/22 [phone]   ?  (approximate) ? ? ?History  ? ?Leg Swelling ? ? ?HPI ? ?Brad [name] is a 46 y.o. male whose medical history includes hypertension and chronic peripheral edema for which he is supposed to occasionally take Lasix but rarely does so. ? ?The patient presents for worsening lower extremity swelling and redness, worse on the right than the left, over the last couple of days.  He has also had increased generalized weakness and was a little bit confused starting tonight.  He initially told triage that he was short of breath but he denies that to me.  He says that he is unaware of having a fever, sore throat, chest pain, shortness of breath, nausea, nor vomiting.  He said he just does not feel well and it has been rapidly worsening over the last couple of days but that the swelling has been present in his legs for a long time. ? ?His wife is at bedside and said that his legs always look bad (for at least a year or more), but they look much worse currently.  He has not been taking any of the furosemide he was previously prescribed.  He was under the impression he was only supposed to take it in case of emergency, rather than just because of some increased swelling. ?  ? ? ?Physical Exam  ? ?Triage Vital Signs: ?ED Triage Vitals  ?Enc Vitals Group  ?   BP 03/24/22 0102 122/79  ?   Pulse Rate 03/24/22 0101 (!) 109  ?   Resp 03/24/22 0101 (!) 24  ?   Temp 03/24/22 0102 97.9 ?F (36.6 ?C)  ?   Temp Source 03/24/22 0101 Oral  ?   SpO2 03/24/22 0101 95 %  ?   Weight 03/24/22 0102 (!) 149.7 kg (330 lb)  ?   Height 03/24/22 0102 1.727 m ('5\' 8"'$ )  ?   Head Circumference --   ?   Peak Flow --   ?   Pain Score 03/24/22 0101 8  ?   Pain Loc --   ?   Pain Edu? --   ?   Excl. in [place]? --   ? ? ?Most recent vital signs: ?Vitals:  ? 03/24/22 0530 03/24/22 0536  ?BP: (!) 191/87 (!) 148/63  ?Pulse: 96 97   ?Resp: 17 17  ?Temp: (!) 101.3 ?F (38.5 ?C)   ?SpO2: 94% 96%  ? ? ? ?General: Awake, no distress.  Slow to respond, seems a bit confused, but responds appropriately given some time. ?CV:  Poor peripheral perfusion, difficult to appreciate if this is an acute, chronic, or acute on chronic issue.  Normal heart sounds.  2-3+ peripheral edema with chronic skin changes and bilateral lower extremities including the entire extremity and the feet.  He has skin thickening and chronic skin darkening but he also has additional skin findings as documented below. ?Resp:  Normal effort.  Lungs are clear to auscultation bilaterally with no wheezing nor coarse breath sounds. ?Abd:  No distention.  His abdomen feels somewhat tight, suggestive of possible anasarca, but he denies any pain and said this is normal for him. ?Skin:  Patient has bright red and patchy erythema most notable on the right lower extremity throughout the entirety of the extremity.  This is most consistent with cellulitis.  He also has some redness on the  left lower extremity but this may be more of a chronic issue.  The right leg appears infected with cellulitis.  However it is minimally tender to palpation, very warm to the touch, not consistent with necrotizing fasciitis. ? ? ?ED Results / Procedures / Treatments  ? ?Labs ?(all labs ordered are listed, but only abnormal results are displayed) ?Labs Reviewed  ?LACTIC ACID, PLASMA - Abnormal; Notable for the following components:  ?    Result Value  ? Lactic Acid, Venous 3.3 (*)   ? All other components within normal limits  ?LACTIC ACID, PLASMA - Abnormal; Notable for the following components:  ? Lactic Acid, Venous 2.7 (*)   ? All other components within normal limits  ?COMPREHENSIVE METABOLIC PANEL - Abnormal; Notable for the following components:  ? Potassium 3.2 (*)   ? Chloride 95 (*)   ? Glucose, Bld 134 (*)   ? BUN 22 (*)   ? Creatinine, Ser 1.38 (*)   ? Calcium 8.4 (*)   ? Albumin 3.3 (*)   ? All other  components within normal limits  ?CBC WITH DIFFERENTIAL/PLATELET - Abnormal; Notable for the following components:  ? WBC 16.6 (*)   ? Neutro Abs 15.0 (*)   ? Lymphs Abs 0.6 (*)   ? Abs Immature Granulocytes 0.20 (*)   ? All other components within normal limits  ?PROTIME-INR - Abnormal; Notable for the following components:  ? Prothrombin Time 17.4 (*)   ? INR 1.4 (*)   ? All other components within normal limits  ?APTT - Abnormal; Notable for the following components:  ? aPTT 40 (*)   ? All other components within normal limits  ?URINALYSIS, COMPLETE (UACMP) WITH MICROSCOPIC - Abnormal; Notable for the following components:  ? Color, Urine AMBER (*)   ? APPearance HAZY (*)   ? Hgb urine dipstick SMALL (*)   ? Protein, ur 100 (*)   ? Bacteria, UA RARE (*)   ? All other components within normal limits  ?RESP PANEL BY RT-PCR (FLU A&B, COVID) [id number]  ?CULTURE, BLOOD (ROUTINE X 2)  ?CULTURE, BLOOD (ROUTINE X 2)  ?URINE CULTURE  ?BRAIN NATRIURETIC PEPTIDE  ?LIPASE, BLOOD  ? ? ? ?EKG ? ?ED ECG REPORT ?IHinda Kehr, the attending physician, personally viewed and interpreted this ECG. ? ?Date: 03/24/2022 ?EKG Time: 1:17 AM ?Rate: 105 ?Rhythm: Sinus tachycardia ?QRS Axis: Right superior axis deviation ?Intervals: Incomplete right bundle branch block with right ventricular hypertrophy ?ST/T Wave abnormalities: Non-specific ST segment / T-wave changes, but no clear evidence of acute ischemia. ?Narrative Interpretation: no definitive evidence of acute ischemia; does not meet STEMI criteria. ? ? ? ?RADIOLOGY ?I personally reviewed the patient's two-view chest x-ray and see no acute abnormalities including no evidence of pneumonia nor pulmonary edema. ? ? ? ?PROCEDURES: ? ?Critical Care performed: Yes, see critical care procedure note(s) ? ?.1-3 Lead EKG Interpretation ?Performed by: Hinda Kehr, MD ?Authorized by: Hinda Kehr, MD  ? ?  Interpretation: abnormal   ?  ECG rate:  110 ?  ECG rate assessment: tachycardic   ?   Rhythm: sinus tachycardia   ?  Ectopy: none   ?  Conduction: normal   ?.Critical Care ?Performed by: Hinda Kehr, MD ?Authorized by: Hinda Kehr, MD  ? ?Critical care provider statement:  ?  Critical care time (minutes):  45 ?  Critical care time was exclusive of:  Separately billable procedures and treating other patients ?  Critical care was necessary to treat or  prevent imminent or life-threatening deterioration of the following conditions:  Sepsis ?  Critical care was time spent personally by me on the following activities:  Development of treatment plan with patient or surrogate, evaluation of patient's response to treatment, examination of patient, obtaining history from patient or surrogate, ordering and performing treatments and interventions, ordering and review of laboratory studies, ordering and review of radiographic studies, pulse oximetry, re-evaluation of patient's condition and review of old charts ? ? ?MEDICATIONS ORDERED IN ED: ?Medications  ?cefTRIAXone (ROCEPHIN) 2 g in sodium chloride 0.9 % 100 mL IVPB (0 g Intravenous Stopped 03/24/22 0330)  ?ibuprofen (ADVIL) tablet 800 mg (has no administration in time range)  ?lactated ringers bolus 1,000 mL (0 mLs Intravenous Stopped 03/24/22 0403)  ?  And  ?lactated ringers bolus 1,000 mL (0 mLs Intravenous Stopped 03/24/22 0455)  ?  And  ?lactated ringers bolus 200 mL (0 mLs Intravenous Stopped 03/24/22 0501)  ?vancomycin (VANCOREADY) IVPB 2000 mg/400 mL (2,000 mg Intravenous New Bag/Given 03/24/22 0329)  ?  Followed by  ?vancomycin (VANCOREADY) IVPB 500 mg/100 mL (0 mg Intravenous Stopped 03/24/22 0404)  ?acetaminophen (TYLENOL) tablet 1,000 mg (1,000 mg Oral Given 03/24/22 0455)  ? ? ? ?IMPRESSION / MDM / ASSESSMENT AND PLAN / ED COURSE  ?I reviewed the triage vital signs and the nursing notes. ?             ?               ? ?Differential diagnosis includes, but is not limited to, sepsis, cellulitis, bacteremia, DVT, heart failure. ? ?The patient is on the  cardiac monitor to evaluate for evidence of arrhythmia and/or significant heart rate changes. ? ?I do not see a diagnosis of heart failure in the computer but he has known venous insufficiency and has bee

## 2022-03-24 NOTE — Progress Notes (Signed)
CODE SEPSIS - PHARMACY COMMUNICATION ? ?**Broad Spectrum Antibiotics should be administered within 1 hour of Sepsis diagnosis** ? ?Time Code Sepsis Called/Page Received: 4/1 @ 0241 ? ?Antibiotics Ordered: Vancomycin 2500 mg IV X 1 and Ceftriaxone 2 gm IV X 1 ? ?Time of 1st antibiotic administration: Vanc 2 gm IV X 1 on 4/1 @ 0241 ? ?Additional action taken by pharmacy:  ? ?If necessary, Name of Provider/Nurse Contacted:  ? ? ? ?Yama Nielson D ,PharmD ?Clinical Pharmacist  ?03/24/2022  3:31 AM ? ?

## 2022-03-24 NOTE — Assessment & Plan Note (Addendum)
As evidenced by fever with a Tmax of 101.3, tachycardia, leukocytosis, cellulitis and lactic acidosis.  Status post IV fluids and asked.  Sepsis now resolved.  Infectious disease consulted.  Initially on IV Rocephin and changed over to cefepime and vancomycin 4/3 when look to be worsening and then Ancef and clindamycin for suspected Streptococcus.  Erythema began to improve.  Procalcitonin level continue to slowly normalize.  ? ?During hospital course however, patient's white blood cell count continued to rise.  Lower extremity Doppler negative for DVT.  ABIs unremarkable.  Infectious disease recommended checking soft tissue ultrasound of right lower extremity given induration and patient found to have some dependent edema with likely some inflammatory fluid, however no signs of any abscess or fluid to tap.  By day of discharge, patient's white blood cell count up to 22, however vital signs stable procalcitonin level normalizing and patient afebrile.  Infectious disease felt white blood cell count likely reactive.  Patient be discharged on 5 more days of Augmentin and will follow-up with his PCP in the next 1 to 2 weeks for a white blood cell count check. ?

## 2022-03-24 NOTE — Sepsis Progress Note (Signed)
Following for sepsis monitoring ?

## 2022-03-24 NOTE — Assessment & Plan Note (Addendum)
AKI resolved with IV fluids, secondary to diuretic use and poor oral intake ?Patient has a baseline serum creatinine of 0.88 and on admission it is 1.38.  Creatinine by 4/2 back to normal. ?

## 2022-03-24 NOTE — ED Notes (Signed)
ONE SET OF BLOOD CULTURES SENT TO LAB 

## 2022-03-24 NOTE — Plan of Care (Signed)

## 2022-03-24 NOTE — ED Notes (Signed)
ED Provider at bedside. 

## 2022-03-24 NOTE — Assessment & Plan Note (Addendum)
Hold hydrochlorothiazide due to dehydration. ?Patient on amlodipine during hospitalization for blood pressure control.  Resume HCTZ upon discharge. ?

## 2022-03-24 NOTE — ED Notes (Signed)
Lactic acid 3.3 critical result given to Dr. Karma Greaser. ?

## 2022-03-24 NOTE — ED Triage Notes (Signed)
Pt complains of bilateral leg swelling and redness for "a awhile". Pt with redness noted up into right thigh. Pt with 3+ edema noted to bilateral lower extremities, pt also complains of shob. Pt denies known fever.  ?

## 2022-03-24 NOTE — Assessment & Plan Note (Addendum)
Secondary to hydrochlorothiazide use and then following normal saline.  Replacing potassium as needed. ?

## 2022-03-24 NOTE — ED Notes (Signed)
Pt placed on 2L [place] while sleeping d/t O2 sats decreasing to 90-92% on RA. MD Karma Greaser notified at this time.  ?

## 2022-03-24 NOTE — Assessment & Plan Note (Signed)
Complicates overall prognosis and care ?Lifestyle modification and exercise has been discussed with patient in detail ?

## 2022-03-24 NOTE — Progress Notes (Signed)
PHARMACY -  BRIEF ANTIBIOTIC NOTE  ? ?Pharmacy has received consult(s) for Vancomycin from an ED provider.  The patient's profile has been reviewed for ht/wt/allergies/indication/available labs.   ? ?One time order(s) placed for Vancomycin 2500 mg IV X 1.  ? ?Further antibiotics/pharmacy consults should be ordered by admitting physician if indicated.       ?                ?Thank you, ?Tejal Monroy D ?03/24/2022  3:04 AM ? ?

## 2022-03-24 NOTE — ED Notes (Signed)
Transport requested

## 2022-03-24 NOTE — H&P (Addendum)
?History and Physical  ? ? ?Patient: Brad [name] JOI:[id number] DOB: [birth year] ?DOA: 03/24/2022 ?DOS: the patient was seen and examined on 03/24/2022 ?PCP: Olin Hauser, DO  ?Patient coming from: Home ? ?Chief Complaint:  ?Chief Complaint  ?Patient presents with  ? Leg Swelling  ? ?HPI: Brad [name] is a 46 y.o. male with medical history significant for morbid obesity, chronic venous insufficiency, hypertension who presents to the ER for evaluation of bilateral leg swelling and redness. ?Patient states that he has chronic bilateral lower extremity swelling but that has worsened over the last couple of days and he also has significant redness involving his right lower extremity with associated pain.  He was noted to be febrile in the ER with a Tmax of 101.3. ?He denies having any chest pain, no shortness of breath, no nausea, no vomiting, no changes in his bowel habits, no urinary symptoms, no dizziness, no lightheadedness, no headache, no blurred vision or focal deficit. ?Review of Systems: As mentioned in the history of present illness. All other systems reviewed and are negative. ?Past Medical History:  ?Diagnosis Date  ? Chronic venous insufficiency of lower extremity   ? Hypertension   ? ?History reviewed. No pertinent surgical history. ?Social History:  reports that he has been smoking cigarettes. He has a 27.00 pack-year smoking history. He has never used smokeless tobacco. He reports current alcohol use of about 1.0 - 2.0 standard drink per week. He reports current drug use. Drug: Marijuana. ? ?No Known Allergies ? ?Family History  ?Problem Relation Age of Onset  ? Leukemia Maternal Grandmother   ? Stroke Maternal Grandfather   ? ? ?Prior to Admission medications   ?Medication Sig Start Date End Date Taking? Authorizing Provider  ?amphetamine-dextroamphetamine (ADDERALL XR) 25 MG 24 hr capsule Take 25 mg by mouth every morning. 03/03/22   [provider]   ?amphetamine-dextroamphetamine (ADDERALL) 10 MG tablet Take 5-10 mg by mouth daily. 02/08/22   [provider]  ?Buprenorphine HCl-Naloxone HCl 8-2 MG FILM Place 1 strip under the tongue 2 (two) times daily. 03/08/22   [provider]  ?furosemide (LASIX) 20 MG tablet Take 1 tablet (20 mg total) by mouth daily as needed for fluid or edema. 02/13/22   Karamalegos, Devonne Doughty, DO  ?hydrochlorothiazide (HYDRODIURIL) 25 MG tablet Take 1 tablet (25 mg total) by mouth daily. 02/13/22   Olin Hauser, DO  ? ? ?Physical Exam: ?Vitals:  ? 03/24/22 0800 03/24/22 0830 03/24/22 0900 03/24/22 0930  ?BP: (!) 142/65 (!) 157/57  (!) 151/73  ?Pulse: 100 97 95 92  ?Resp: '15 14 13 12  '$ ?Temp:      ?TempSrc:      ?SpO2: 96% 96% 97% 96%  ?Weight:      ?Height:      ? ?Physical Exam ?Vitals and nursing note reviewed.  ?Constitutional:   ?   Appearance: He is obese.  ?HENT:  ?   Head: Normocephalic and atraumatic.  ?   Nose: Nose normal.  ?   Mouth/Throat:  ?   Mouth: Mucous membranes are dry.  ?Eyes:  ?   Conjunctiva/sclera: Conjunctivae normal.  ?Cardiovascular:  ?   Rate and Rhythm: Tachycardia present.  ?Pulmonary:  ?   Effort: Pulmonary effort is normal.  ?   Breath sounds: Normal breath sounds.  ?Abdominal:  ?   General: Abdomen is flat.  ?   Palpations: Abdomen is soft.  ?   Comments: Central adiposity  ?Musculoskeletal:  ?  Cervical back: Normal range of motion and neck supple.  ?Skin: ?   General: Skin is warm.  ?   Findings: Erythema present.  ?   Comments: Extensive redness involving the right lower extremity with differential warmth  ?Neurological:  ?   General: No focal deficit present.  ?   Mental Status: He is alert and oriented to person, place, and time.  ?Psychiatric:     ?   Mood and Affect: Mood normal.     ?   Behavior: Behavior normal.  ? ? ?Data Reviewed: ?Relevant notes from primary care and specialist visits, past discharge summaries as available in EHR, including Care Everywhere. ?Prior  diagnostic testing as pertinent to current admission diagnoses ?Updated medications and problem lists for reconciliation ?ED course, including vitals, labs, imaging, treatment and response to treatment ?Triage notes, nursing and pharmacy notes and ED provider's notes ?Notable results as noted in HPI ?Labs reviewed.  White count 16.6, lactic acid 3.3 >> 2.7, sodium 137, potassium 3.2, chloride 95, glucose 134, BUN 22, creatinine 1.38 compared to baseline of 0.97 ?Chest x-ray reviewed by me shows no evidence of acute cardiopulmonary disease ?Twelve-lead EKG reviewed by me shows sinus tachycardia.  Incomplete right bundle branch block and RVH ?There are no new results to review at this time. ? ?Assessment and Plan: ?* Sepsis due to cellulitis [facility]) ?As evidenced by fever with a Tmax of 101.3, tachycardia, leukocytosis, cellulitis and lactic acidosis. ?Continue aggressive IV fluid resuscitation ?Place patient empirically on Rocephin 2 g IV daily ?Elevate right lower extremity ?Obtain right leg venous Doppler to rule out DVT ? ?Dehydration ?Secondary to diuretic use and poor oral intake ?Patient has a baseline serum creatinine of 0.88 and today on admission it is 1.38 ?Continue IV fluid hydration ?Hold hydrochlorothiazide ? ?Hypokalemia ?Secondary to hydrochlorothiazide use ?Supplement potassium ?Check magnesium levels ? ?Morbid obesity with BMI of 50.0-59.9, adult ([place]) ?Complicates overall prognosis and care ?Lifestyle modification and exercise has been discussed with patient in detail ? ?Essential hypertension ?Hold hydrochlorothiazide due to dehydration ?We will place patient on amlodipine for blood pressure control ? ? ? ? ? Advance Care Planning:   Code Status: Full Code  ? ?Consults: None ? ?Family Communication: Greater than 50% of time was spent discussing patient's condition and plan of care with him at the bedside.  All questions and concerns have been addressed.  He verbalizes understanding and agrees with  the plan. ? ?Severity of Illness: ?The appropriate patient status for this patient is INPATIENT. Inpatient status is judged to be reasonable and necessary in order to provide the required intensity of service to ensure the patient's safety. The patient's presenting symptoms, physical exam findings, and initial radiographic and laboratory data in the context of their chronic comorbidities is felt to place them at high risk for further clinical deterioration. Furthermore, it is not anticipated that the patient will be medically stable for discharge from the hospital within 2 midnights of admission.  ? ?* I certify that at the point of admission it is Brad clinical judgment that the patient will require inpatient hospital care spanning beyond 2 midnights from the point of admission due to high intensity of service, high risk for further deterioration and high frequency of surveillance required.* ? ?Author: ?Collier Bullock, MD ?03/24/2022 10:41 AM ? ?For on call review [email or web address].  ?

## 2022-03-25 ENCOUNTER — Inpatient Hospital Stay: Payer: BC Managed Care – PPO

## 2022-03-25 DIAGNOSIS — A419 Sepsis, unspecified organism: Secondary | ICD-10-CM | POA: Diagnosis not present

## 2022-03-25 DIAGNOSIS — L0390 Cellulitis, unspecified: Secondary | ICD-10-CM | POA: Diagnosis not present

## 2022-03-25 LAB — BASIC METABOLIC PANEL
Anion gap: 10 (ref 5–15)
BUN: 18 mg/dL (ref 6–20)
CO2: 26 mmol/L (ref 22–32)
Calcium: 7.8 mg/dL — ABNORMAL LOW (ref 8.9–10.3)
Chloride: 95 mmol/L — ABNORMAL LOW (ref 98–111)
Creatinine, Ser: 0.87 mg/dL (ref 0.61–1.24)
GFR, Estimated: >60 mL/min (ref >60)
Glucose, Bld: 114 mg/dL — ABNORMAL HIGH (ref 70–99)
Potassium: 2.9 mmol/L — ABNORMAL LOW (ref 3.5–5.1)
Sodium: 131 mmol/L — ABNORMAL LOW (ref 135–145)

## 2022-03-25 LAB — PROTIME-INR
INR: 1.1 (ref 0.8–1.2)
Prothrombin Time: 14.2 seconds (ref 11.4–15.2)

## 2022-03-25 LAB — URINE CULTURE: Culture: NO GROWTH

## 2022-03-25 LAB — PROCALCITONIN: Procalcitonin: 5.72 ng/mL

## 2022-03-25 LAB — CBC
HCT: 39.3 % (ref 39.0–52.0)
Hemoglobin: 13.1 g/dL (ref 13.0–17.0)
MCH: 27.5 pg (ref 26.0–34.0)
MCHC: 33.3 g/dL (ref 30.0–36.0)
MCV: 82.6 fL (ref 80.0–100.0)
Platelets: 186 10*3/uL (ref 150–400)
RBC: 4.76 MIL/uL (ref 4.22–5.81)
RDW: 13.9 % (ref 11.5–15.5)
WBC: 12.5 10*3/uL — ABNORMAL HIGH (ref 4.0–10.5)
nRBC: 0 % (ref 0.0–0.2)

## 2022-03-25 LAB — CORTISOL-AM, BLOOD: Cortisol - AM: 18.5 ug/dL (ref 6.7–22.6)

## 2022-03-25 LAB — BRAIN NATRIURETIC PEPTIDE: B Natriuretic Peptide: 30.1 pg/mL (ref 0.0–100.0)

## 2022-03-25 LAB — HIV ANTIBODY (ROUTINE TESTING W REFLEX): HIV Screen 4th Generation wRfx: NONREACTIVE

## 2022-03-25 MED ORDER — VANCOMYCIN HCL IN DEXTROSE 1-5 GM/200ML-% IV SOLN: 1000.0000 mg | Freq: Once | INTRAVENOUS | Status: DC

## 2022-03-25 MED ORDER — VANCOMYCIN HCL 1500 MG/300ML IV SOLN
1500.0000 mg | Freq: Two times a day (BID) | INTRAVENOUS | Status: DC
  Administered 2022-03-25 – 2022-03-26 (×3): 1500 mg via INTRAVENOUS
  Filled 2022-03-25 (×3): qty 300

## 2022-03-25 MED ORDER — AMLODIPINE BESYLATE 5 MG PO TABS
2.5000 mg | ORAL_TABLET | Freq: Every day | ORAL | Status: DC
  Administered 2022-03-26 – 2022-03-31 (×6): 2.5 mg via ORAL
  Filled 2022-03-25 (×6): qty 1

## 2022-03-25 MED ORDER — SODIUM CHLORIDE 0.9 % IV SOLN: 2.0000 g | Freq: Once | INTRAVENOUS | Status: DC

## 2022-03-25 MED ORDER — POTASSIUM CHLORIDE CRYS ER 20 MEQ PO TBCR
40.0000 meq | EXTENDED_RELEASE_TABLET | ORAL | Status: AC
  Administered 2022-03-25 (×2): 40 meq via ORAL
  Filled 2022-03-25 (×2): qty 2

## 2022-03-25 MED ORDER — CEFEPIME HCL 2 G IJ SOLR
2.0000 g | Freq: Three times a day (TID) | INTRAMUSCULAR | Status: DC
  Administered 2022-03-25 – 2022-03-26 (×3): 2 g via INTRAVENOUS
  Filled 2022-03-25 (×4): qty 2

## 2022-03-25 NOTE — Progress Notes (Signed)
Pharmacy Antibiotic Note ? ?Brad [name] is a 46 y.o. male admitted on 03/24/2022 with Sepsis secondary to cellulitis. Pharmacy has been consulted for Vancomycin and Cefepime dosing. ? ?Plan: ?Start cefepime 2g IV every 8 hours  ? ?Patient received Vancomycin '2500mg'$  IV bolus on 4/1. Will start Vancomycin '1500mg'$  IV every 12 hours.  ?Goal AUC 400-550. ?Expected AUC: 442 ?SCr used: 0.87 ? ? ?Height: '5\' 8"'$  (172.7 cm) ?Weight: (!) 149.7 kg (330 lb) ?IBW/kg (Calculated) : 68.4 ? ?Temp (24hrs), Avg:98.3 ?F (36.8 ?C), Min:97.9 ?F (36.6 ?C), Max:98.6 ?F (37 ?C) ? ?Recent Labs  ?Lab 03/24/22 ?0112 03/24/22 ?0456 03/24/22 ?0955 03/25/22 ?0510  ?WBC 16.6*  --   --  12.5*  ?CREATININE 1.38*  --   --  0.87  ?LATICACIDVEN 3.3* 2.7* 1.3  --   ?  ?Estimated Creatinine Clearance: 153 mL/min (by C-G formula based on SCr of 0.87 mg/dL).   ? ?No Known Allergies ? ?Antimicrobials this admission: ?4/1 Ceftriaxone  >>4/2 ?4/1 vancomycin  >>  ?4/2 Cefepime>> ? ?Microbiology results: ?4/2 UCx:  ? ?Thank you for allowing pharmacy to be a part of this patient?s care. ? ?Pernell Dupre, PharmD, BCPS ?Clinical Pharmacist ?03/25/2022 ?10:01 AM ? ? ?

## 2022-03-25 NOTE — Progress Notes (Signed)
?PROGRESS NOTE ? ? ? ?Brad [name]  VHQ:[id number] DOB: [birth year] DOA: 03/24/2022 ?PCP: Olin Hauser, DO  ? ? ?Brief Narrative:  ?Brad [name] is a 46 y.o. male with medical history significant for morbid obesity, chronic venous insufficiency, hypertension who presents to the ER for evaluation of bilateral leg swelling and redness. ?Patient states that he has chronic bilateral lower extremity swelling but that has worsened over the last couple of days and he also has significant redness involving his right lower extremity with associated pain.  He was noted to be febrile in the ER with a Tmax of 101.3. ?He denies having any chest pain, no shortness of breath, no nausea, no vomiting, no changes in his bowel habits, no urinary symptoms, no dizziness, no lightheadedness, no headache, no blurred vision or focal deficit. ? ?4/2 does not feel leg redness has changed.  ? ?Consultants:  ? ? ?Procedures:  ? ?Antimicrobials:  ?Ceftriaxone ?  ? ? ?Subjective: ?Reports sob at times with ambulation. Does smoke. But reports will quit. No cp ? ?Objective: ?Vitals:  ? 03/24/22 1546 03/24/22 2013 03/25/22 0425 03/25/22 0721  ?BP: 122/66 139/79 (!) 124/56 137/70  ?Pulse: 85 100 95 99  ?Resp: '15 20 20 18  '$ ?Temp: 98.2 ?F (36.8 ?C) 98.6 ?F (37 ?C) 97.9 ?F (36.6 ?C) 98.5 ?F (36.9 ?C)  ?TempSrc: Oral Oral Oral Oral  ?SpO2: 95% 99% 98% 99%  ?Weight:      ?Height:      ? ? ?Intake/Output Summary (Last 24 hours) at 03/25/2022 0820 ?Last data filed at 03/25/2022 4132 ?Gross per 24 hour  ?Intake 2120.61 ml  ?Output --  ?Net 2120.61 ml  ? ?Filed Weights  ? 03/24/22 0102  ?Weight: (!) 149.7 kg  ? ? ?Examination: ?Calm, NAD ?Cta no w/r ?Reg s1/s2 no gallop ?Soft benign +bs ?B/l edema3+, underlying venous insufficiency, RLE thight and leg medially with erythema, warmth to touch ?[place]ox3  ?Mood and affect appropriate in current setting  ? ? ? ?Data Reviewed: I have personally reviewed following labs and imaging  studies ? ?CBC: ?Recent Labs  ?Lab 03/24/22 ?0112 03/25/22 ?0510  ?WBC 16.6* 12.5*  ?NEUTROABS 15.0*  --   ?HGB 15.2 13.1  ?HCT 47.8 39.3  ?MCV 83.4 82.6  ?PLT 242 186  ? ?Basic Metabolic Panel: ?Recent Labs  ?Lab 03/24/22 ?0112 03/24/22 ?1037 03/25/22 ?0510  ?NA 137  --  131*  ?K 3.2*  --  2.9*  ?CL 95*  --  95*  ?CO2 27  --  26  ?GLUCOSE 134*  --  114*  ?BUN 22*  --  18  ?CREATININE 1.38*  --  0.87  ?CALCIUM 8.4*  --  7.8*  ?MG  --  1.6*  --   ? ?GFR: ?Estimated Creatinine Clearance: 153 mL/min (by C-G formula based on SCr of 0.87 mg/dL). ?Liver Function Tests: ?Recent Labs  ?Lab 03/24/22 ?0112  ?AST 39  ?ALT 26  ?ALKPHOS 45  ?BILITOT 0.8  ?PROT 8.1  ?ALBUMIN 3.3*  ? ?Recent Labs  ?Lab 03/24/22 ?0112  ?LIPASE 20  ? ?No results for input(s): AMMONIA in the last 168 hours. ?Coagulation Profile: ?Recent Labs  ?Lab 03/24/22 ?0112 03/25/22 ?0510  ?INR 1.4* 1.1  ? ?Cardiac Enzymes: ?No results for input(s): CKTOTAL, CKMB, CKMBINDEX, TROPONINI in the last 168 hours. ?BNP (last 3 results) ?No results for input(s): PROBNP in the last 8760 hours. ?HbA1C: ?No results for input(s): HGBA1C in the last 72 hours. ?CBG: ?No results  for input(s): GLUCAP in the last 168 hours. ?Lipid Profile: ?No results for input(s): CHOL, HDL, LDLCALC, TRIG, CHOLHDL, LDLDIRECT in the last 72 hours. ?Thyroid Function Tests: ?No results for input(s): TSH, T4TOTAL, FREET4, T3FREE, THYROIDAB in the last 72 hours. ?Anemia Panel: ?No results for input(s): VITAMINB12, FOLATE, FERRITIN, TIBC, IRON, RETICCTPCT in the last 72 hours. ?Sepsis Labs: ?Recent Labs  ?Lab 03/24/22 ?0112 03/24/22 ?0456 03/24/22 ?0955 03/25/22 ?0510  ?PROCALCITON  --   --   --  5.72  ?LATICACIDVEN 3.3* 2.7* 1.3  --   ? ? ?Recent Results (from the past 240 hour(s))  ?Resp Panel by RT-PCR (Flu A&B, Covid) Nasopharyngeal Swab     Status: None  ? Collection Time: 03/24/22  3:09 AM  ? Specimen: Nasopharyngeal Swab; Nasopharyngeal(NP) swabs in vial transport medium  ?Result Value Ref  Range Status  ? SARS Coronavirus 2 by RT PCR NEGATIVE NEGATIVE Final  ?  Comment: (NOTE) ?SARS-CoV-2 target nucleic acids are NOT DETECTED. ? ?The SARS-CoV-2 RNA is generally detectable in upper respiratory ?specimens during the acute phase of infection. The lowest ?concentration of SARS-CoV-2 viral copies this assay can detect is ?138 copies/mL. A negative result does not preclude SARS-Cov-2 ?infection and should not be used as the sole basis for treatment or ?other patient management decisions. A negative result may occur with  ?improper specimen collection/handling, submission of specimen other ?than nasopharyngeal swab, presence of viral mutation(s) within the ?areas targeted by this assay, and inadequate number of viral ?copies(<138 copies/mL). A negative result must be combined with ?clinical observations, patient history, and epidemiological ?information. The expected result is Negative. ? ?Fact Sheet for Patients:  ?EntrepreneurPulse.com.au ? ?Fact Sheet for Healthcare Providers:  ?IncredibleEmployment.be ? ?This test is no t yet approved or cleared by the [place] FDA and  ?has been authorized for detection and/or diagnosis of SARS-CoV-2 by ?FDA under an Emergency Use Authorization (EUA). This EUA will remain  ?in effect (meaning this test can be used) for the duration of the ?COVID-19 declaration under Section 564(b)(1) of the Act, 21 ?U.S.C.section 360bbb-3(b)(1), unless the authorization is terminated  ?or revoked sooner.  ? ? ?  ? Influenza A by PCR NEGATIVE NEGATIVE Final  ? Influenza B by PCR NEGATIVE NEGATIVE Final  ?  Comment: (NOTE) ?The Xpert Xpress SARS-CoV-2/FLU/RSV plus assay is intended as an aid ?in the diagnosis of influenza from Nasopharyngeal swab specimens and ?should not be used as a sole basis for treatment. Nasal washings and ?aspirates are unacceptable for Xpert Xpress SARS-CoV-2/FLU/RSV ?testing. ? ?Fact Sheet for  Patients: ?EntrepreneurPulse.com.au ? ?Fact Sheet for Healthcare Providers: ?IncredibleEmployment.be ? ?This test is not yet approved or cleared by the [place] FDA and ?has been authorized for detection and/or diagnosis of SARS-CoV-2 by ?FDA under an Emergency Use Authorization (EUA). This EUA will remain ?in effect (meaning this test can be used) for the duration of the ?COVID-19 declaration under Section 564(b)(1) of the Act, 21 U.S.C. ?section 360bbb-3(b)(1), unless the authorization is terminated or ?revoked. ? ?Performed at [facility], [place], ?[place] [address] ?  ?Blood Culture (routine x 2)     Status: None (Preliminary result)  ? Collection Time: 03/24/22  3:09 AM  ? Specimen: BLOOD  ?Result Value Ref Range Status  ? Specimen Description BLOOD BLOOD RIGHT FOREARM  Final  ? Special Requests   Final  ?  BOTTLES DRAWN AEROBIC AND ANAEROBIC Blood Culture adequate volume  ? Culture   Final  ?  NO  GROWTH 1 DAY ?Performed at [facility], [address] [place], [place] [address] ?  ? Report Status PENDING  Incomplete  ?  ? ? ? ? ? ?Radiology Studies: ?DG Chest 2 View ? ?Result Date: 03/24/2022 ?CLINICAL DATA:  Shortness of breath EXAM: CHEST - 2 VIEW COMPARISON:  None FINDINGS: Heart and mediastinal contours are within normal limits. No focal opacities or effusions. No acute bony abnormality. IMPRESSION: No active cardiopulmonary disease. Electronically Signed   By: Rolm Baptise M.D.   On: 03/24/2022 02:00  ? ?US Venous Img Lower Unilateral Right (DVT) ? ?Result Date: 03/24/2022 ?CLINICAL DATA:  Pain and edema.  Color changes. EXAM: RIGHT LOWER EXTREMITY VENOUS DOPPLER ULTRASOUND TECHNIQUE: Gray-scale sonography with compression, as well as color and duplex ultrasound, were performed to evaluate the deep venous system(s) from the level of the common femoral vein through the popliteal and proximal calf veins. COMPARISON:  None. FINDINGS:  VENOUS Normal compressibility of the common femoral, superficial femoral, and popliteal veins, as well as the visualized calf veins. Visualized portions of profunda femoral vein and great saphenous vein unremarkable. No filling defects to suggest

## 2022-03-26 ENCOUNTER — Inpatient Hospital Stay (HOSPITAL_COMMUNITY)
Admit: 2022-03-26 | Discharge: 2022-03-26 | Disposition: A | Discharge status: Home / Self Care | Payer: BC Managed Care – PPO | Attending: Internal Medicine | Admitting: Internal Medicine

## 2022-03-26 DIAGNOSIS — L03115 Cellulitis of right lower limb: Secondary | ICD-10-CM

## 2022-03-26 DIAGNOSIS — A419 Sepsis, unspecified organism: Secondary | ICD-10-CM | POA: Diagnosis not present

## 2022-03-26 DIAGNOSIS — I330 Acute and subacute infective endocarditis: Secondary | ICD-10-CM

## 2022-03-26 DIAGNOSIS — L0390 Cellulitis, unspecified: Secondary | ICD-10-CM | POA: Diagnosis not present

## 2022-03-26 LAB — ECHOCARDIOGRAM COMPLETE
AR max vel: 3.71 cm2
AV Area VTI: 4.03 cm2
AV Area mean vel: 3.16 cm2
AV Mean grad: 6 mmHg
AV Peak grad: 9.6 mmHg
Ao pk vel: 1.55 m/s
Area-P 1/2: 5.84 cm2
Calc EF: 66.5 %
Height: 68 in
MV VTI: 6.73 cm2
S' Lateral: 3.5 cm
Single Plane A2C EF: 67.7 %
Single Plane A4C EF: 65.9 %
Weight: 5280 oz

## 2022-03-26 LAB — SODIUM: Sodium: 134 mmol/L — ABNORMAL LOW (ref 135–145)

## 2022-03-26 LAB — POTASSIUM: Potassium: 3.1 mmol/L — ABNORMAL LOW (ref 3.5–5.1)

## 2022-03-26 MED ORDER — CEFAZOLIN SODIUM-DEXTROSE 2-4 GM/100ML-% IV SOLN
2.0000 g | Freq: Three times a day (TID) | INTRAVENOUS | Status: DC
  Administered 2022-03-26 – 2022-03-31 (×15): 2 g via INTRAVENOUS
  Filled 2022-03-26 (×17): qty 100

## 2022-03-26 MED ORDER — CLINDAMYCIN PHOSPHATE 600 MG/50ML IV SOLN
600.0000 mg | Freq: Three times a day (TID) | INTRAVENOUS | Status: AC
Start: 2022-03-26 — End: 2022-03-28
  Administered 2022-03-26 – 2022-03-28 (×8): 600 mg via INTRAVENOUS
  Filled 2022-03-26 (×9): qty 50

## 2022-03-26 MED ORDER — VANCOMYCIN HCL 1750 MG/350ML IV SOLN: 1750.0000 mg | Freq: Two times a day (BID) | INTRAVENOUS | Status: DC

## 2022-03-26 MED ORDER — POTASSIUM CHLORIDE CRYS ER 20 MEQ PO TBCR
40.0000 meq | EXTENDED_RELEASE_TABLET | ORAL | Status: AC
  Administered 2022-03-26 (×2): 40 meq via ORAL
  Filled 2022-03-26 (×2): qty 2

## 2022-03-26 MED ORDER — PERFLUTREN LIPID MICROSPHERE
1.0000 mL | INTRAVENOUS | Status: AC | PRN
  Administered 2022-03-26: 2 mL via INTRAVENOUS
  Filled 2022-03-26: qty 10

## 2022-03-26 MED ORDER — RISAQUAD PO CAPS
1.0000 | ORAL_CAPSULE | Freq: Every day | ORAL | Status: DC
  Administered 2022-03-26 – 2022-03-31 (×6): 1 via ORAL
  Filled 2022-03-26 (×6): qty 1

## 2022-03-26 NOTE — TOC Initial Note (Signed)
Transition of Care (TOC) - Initial/Assessment Note  ? ? ?Patient Details  ?Name: SHAYMUS EVELETH III ?MRN: [id number] ?Date of Birth: 04/04/1976 ? ?Transition of Care (TOC) CM/SW Contact:    ?Beverly Sessions, RN ?Phone Number: ?03/26/2022, 9:38 AM ? ?Clinical Narrative:                 ? ? ?  ?Transition of Care (TOC) Screening Note ? ? ?Patient Details  ?Name: JAHRED TATAR III ?Date of Birth: Aug 15, 1976 ? ? ?Transition of Care (TOC) CM/SW Contact:    ?Beverly Sessions, RN ?Phone Number: ?03/26/2022, 9:38 AM ? ? ? ?Transition of Care Department [facility]) has reviewed patient and no TOC needs have been identified at this time. We will continue to monitor patient advancement through interdisciplinary progression rounds. If new patient transition needs arise, please place a TOC consult. ? ? ?  ? ? ?Patient Goals and CMS Choice ?  ?  ?  ? ?Expected Discharge Plan and Services ?  ?  ?  ?  ?  ?                ?  ?  ?  ?  ?  ?  ?  ?  ?  ?  ? ?Prior Living Arrangements/Services ?  ?  ?  ?       ?  ?  ?  ?  ? ?Activities of Daily Living ?Home Assistive Devices/Equipment: None ?ADL Screening (condition at time of admission) ?Patient's cognitive ability adequate to safely complete daily activities?: Yes ?Is the patient deaf or have difficulty hearing?: No ?Does the patient have difficulty seeing, even when wearing glasses/contacts?: No ?Does the patient have difficulty concentrating, remembering, or making decisions?: No ?Patient able to express need for assistance with ADLs?: No ?Does the patient have difficulty dressing or bathing?: No ?Independently performs ADLs?: Yes (appropriate for developmental age) ?Does the patient have difficulty walking or climbing stairs?: No ?Weakness of Legs: None ?Weakness of Arms/Hands: None ? ?Permission Sought/Granted ?  ?  ?   ?   ?   ?   ? ?Emotional Assessment ?  ?  ?  ?  ?  ?  ? ?Admission diagnosis:  Acute kidney injury ([place]) [N17.9] ?Cellulitis of multiple sites of lower extremity  [L03.119] ?Sepsis ([place]) [A41.9] ?Severe sepsis ([place]) [A41.9, R65.20] ?Venous insufficiency of both lower extremities [I87.2] ?Patient Active Problem List  ? Diagnosis Date Noted  ? Sepsis due to cellulitis ([place]) 03/24/2022  ? Hypokalemia 03/24/2022  ? Dehydration 03/24/2022  ? Venous insufficiency of both lower extremities 02/13/2022  ? Essential hypertension 12/26/2021  ? Morbid obesity with BMI of 50.0-59.9, adult ([place]) 12/26/2021  ? ?PCP:  Olin Hauser, DO ?Pharmacy:   ?[facility] [place], [place] AT [facility] [address] ?[place] ?[place] [address] ?Phone: [phone] Fax: [phone] ? ?[id number] [place] - [address] ?[address] ?[place]?Phone: [phone]: [phone]? ? ? ? ?Social Determinants of Health (SDOH) Interventions ?  ? ?Readmission Risk Interventions ?   ? View : No data to display.  ?  ?  ?  ? ? ? ?

## 2022-03-26 NOTE — Consult Note (Addendum)
NAME: Brad [name]  ?DOB: 19-Oct-1976  ?MRN: [id number]  ?Date/Time: 03/26/2022 11:21 AM ? ?REQUESTING PROVIDER: Dr. Kurtis Bushman ?Subjective:  ?REASON FOR CONSULT: rt leg cellulitis ?? ?Brad [name] is a 46 y.o. male with a history of HTN, venous edema legs, BMI 50, presents with pain, swelling rt leg with fever of 4 days duration. ?As per patient he works for [employer]. He was at work on thursday and was well. On Friday he was feeling weak and some chills- he then noted his rt leg was very swollen , red and painful. As it was getting worse he came to the ED on 03/24/2022. ?In the ED vitals BP of 139/79, temperature 98.6, heart rate 100, respiratory 20 and sats 99%.  Tmax 100.7.  Labs revealed WBC of 16.6, Hb 15.2 and platelet 242.  Creatinine 1.38, potassium 3.1 lactate 3.3.  Blood culture was sent.  He was initially started on IV ceftriaxone.  This was changed to Vanco and cefepime yesterday as the leg was getting worse.  I am asked to see the patient today for the cellulitis. ? ?Past Medical History:  ?Diagnosis Date  ? Chronic venous insufficiency of lower extremity   ? Hypertension   ?  ?History reviewed. No pertinent surgical history.  ?Social History  ? ?Socioeconomic History  ? Marital status: Single  ?  Spouse name: Not on file  ? Number of children: Not on file  ? Years of education: Not on file  ? Highest education level: Not on file  ?Occupational History  ? Not on file  ?Tobacco Use  ? Smoking status: Some Days  ?  Packs/day: 1.00  ?  Years: 27.00  ?  Pack years: 27.00  ?  Types: Cigarettes  ? Smokeless tobacco: Never  ?Vaping Use  ? Vaping Use: Never used  ?Substance and Sexual Activity  ? Alcohol use: Yes  ?  Alcohol/week: 1.0 - 2.0 standard drink  ?  Types: 1 - 2 Standard drinks or equivalent per week  ? Drug use: Yes  ?  Types: Marijuana  ? Sexual activity: Not on file  ?Other Topics Concern  ? Not on file  ?Social History Narrative  ? Not on file  ? ?Social Determinants of Health  ? ?Financial  Resource Strain: Not on file  ?Food Insecurity: Not on file  ?Transportation Needs: Not on file  ?Physical Activity: Not on file  ?Stress: Not on file  ?Social Connections: Not on file  ?Intimate Partner Violence: Not on file  ?  ?Family History  ?Problem Relation Age of Onset  ? Leukemia Maternal Grandmother   ? Stroke Maternal Grandfather   ? ?No Known Allergies ?I? ?Current Facility-Administered Medications  ?Medication Dose Route Frequency Provider Last Rate Last Admin  ? acetaminophen (TYLENOL) tablet 650 mg  650 mg Oral Q6H PRN Agbata, Tochukwu, MD   650 mg at 03/25/22 2135  ? Or  ? acetaminophen (TYLENOL) suppository 650 mg  650 mg Rectal Q6H PRN Agbata, Tochukwu, MD      ? amLODipine (NORVASC) tablet 2.5 mg  2.5 mg Oral Daily Nolberto Hanlon, MD   2.5 mg at 03/26/22 4008  ? amphetamine-dextroamphetamine (ADDERALL XR) 24 hr capsule 25 mg  25 mg Oral q morning Agbata, Tochukwu, MD   25 mg at 03/26/22 0944  ? buprenorphine-naloxone (SUBOXONE) 8-2 mg per SL tablet 1 tablet  1 tablet Sublingual Daily Agbata, Tochukwu, MD   1 tablet at 03/26/22 0939  ? ceFAZolin (ANCEF) IVPB 2g/100  mL premix  2 g Intravenous Q8H Tomie Spizzirri, Joellyn Quails, MD      ? clindamycin (CLEOCIN) IVPB 600 mg  600 mg Intravenous Q8H Guida Asman, MD      ? enoxaparin (LOVENOX) injection 75 mg  0.5 mg/kg Subcutaneous Q24H Agbata, Tochukwu, MD   75 mg at 03/25/22 2135  ? nicotine (NICODERM CQ - dosed in mg/24 hours) patch 14 mg  14 mg Transdermal Daily Agbata, Tochukwu, MD   14 mg at 03/26/22 0939  ? ondansetron (ZOFRAN) tablet 4 mg  4 mg Oral Q6H PRN Agbata, Tochukwu, MD      ? Or  ? ondansetron (ZOFRAN) injection 4 mg  4 mg Intravenous Q6H PRN Agbata, Tochukwu, MD      ?  ? ?Abtx:  ?Anti-infectives (From admission, onward)  ? ? Start     Dose/Rate Route Frequency Ordered Stop  ? 03/26/22 2200  vancomycin (VANCOREADY) IVPB 1750 mg/350 mL  Status:  Discontinued       ? 1,750 mg ?175 mL/hr over 120 Minutes Intravenous Every 12 hours  03/26/22 1050 03/26/22 1120  ? 03/26/22 1400  ceFAZolin (ANCEF) IVPB 2g/100 mL premix       ? 2 g ?200 mL/hr over 30 Minutes Intravenous Every 8 hours 03/26/22 1120    ? 03/26/22 1400  clindamycin (CLEOCIN) IVPB 600 mg       ? 600 mg ?100 mL/hr over 30 Minutes Intravenous Every 8 hours 03/26/22 1120    ? 03/25/22 1100  ceFEPIme (MAXIPIME) 2 g in sodium chloride 0.9 % 100 mL IVPB  Status:  Discontinued       ? 2 g ?200 mL/hr over 30 Minutes Intravenous Every 8 hours 03/25/22 0950 03/26/22 1120  ? 03/25/22 1045  vancomycin (VANCOCIN) IVPB 1000 mg/200 mL premix  Status:  Discontinued       ? 1,000 mg ?200 mL/hr over 60 Minutes Intravenous  Once 03/25/22 0946 03/25/22 0949  ? 03/25/22 1045  ceFEPIme (MAXIPIME) 2 g in sodium chloride 0.9 % 100 mL IVPB  Status:  Discontinued       ? 2 g ?200 mL/hr over 30 Minutes Intravenous  Once 03/25/22 0946 03/25/22 0949  ? 03/25/22 1000  vancomycin (VANCOREADY) IVPB 1500 mg/300 mL  Status:  Discontinued       ? 1,500 mg ?150 mL/hr over 120 Minutes Intravenous Every 12 hours 03/25/22 0956 03/26/22 1050  ? 03/25/22 0000  cefTRIAXone (ROCEPHIN) 2 g in sodium chloride 0.9 % 100 mL IVPB  Status:  Discontinued       ? 2 g ?200 mL/hr over 30 Minutes Intravenous Every 24 hours 03/24/22 0947 03/24/22 0947  ? 03/24/22 0315  cefTRIAXone (ROCEPHIN) 2 g in sodium chloride 0.9 % 100 mL IVPB  Status:  Discontinued       ? 2 g ?200 mL/hr over 30 Minutes Intravenous Every 24 hours 03/24/22 0301 03/25/22 0946  ? 03/24/22 0315  vancomycin (VANCOREADY) IVPB 2000 mg/400 mL       ?See Hyperspace for full Linked Orders Report.  ? 2,000 mg ?200 mL/hr over 120 Minutes Intravenous  Once 03/24/22 0303 03/24/22 0604  ? 03/24/22 0315  vancomycin (VANCOREADY) IVPB 500 mg/100 mL       ?See Hyperspace for full Linked Orders Report.  ? 500 mg ?100 mL/hr over 60 Minutes Intravenous  Once 03/24/22 0303 03/24/22 0404  ? ?  ? ? ?REVIEW OF SYSTEMS:  ?Const:  fever,  chills, negative weight loss ?Eyes: negative diplopia  or  visual changes, negative eye pain ?ENT: negative coryza, negative sore throat ?Resp: negative cough, hemoptysis, dyspnea ?Cards: negative for chest pain, palpitations, lower extremity edema ?GU: negative for frequency, dysuria and hematuria ?GI: Negative for abdominal pain, diarrhea, bleeding, constipation ?Skin: negative for rash and pruritus ?Heme: negative for easy bruising and gum/nose bleeding ?MS: General weakness ?Neurolo:negative for headaches, dizziness, vertigo, memory problems  ?Psych: negative for feelings of anxiety, depression  ?Endocrine: negative for thyroid, diabetes ?Allergy/Immunology- negative for any medication or food allergies ?? ? ?Objective:  ?VITALS:  ?BP (!) 165/62 (BP Location: Right Arm)   Pulse 98   Temp 98.2 ?F (36.8 ?C)   Resp 19   Ht '5\' 8"'$  (1.727 m)   Wt (!) 149.7 kg   SpO2 100%   BMI 50.18 kg/m?  ?PHYSICAL EXAM:  ?General: Alert, cooperative, no distress, appears stated age.  Morbid obesity ?Dry mouth ?Head: Normocephalic, without obvious abnormality, atraumatic. ?Eyes: Conjunctivae clear, anicteric sclerae. Pupils are equal ?ENT Nares normal. No drainage or sinus tenderness. ?Lips, mucosa, and tongue normal. No Thrush ?Neck: Supple, symmetrical, no adenopathy, thyroid: non tender ?no carotid bruit and no JVD. ?Back: No CVA tenderness. ?Lungs: Clear to auscultation bilaterally. No Wheezing or Rhonchi. No rales. ?Heart: Regular rate and rhythm, no murmur, rub or gallop. ?Abdomen: Soft, non-tender,not distended. Bowel sounds normal. No masses ?Extremities: Edema legs right more than the left ?Right leg erythematous ?Erythema extending to the thigh as well. ?Right foot is dry with a fissure  ? ? ? ? ? ?skin: No rashes or lesions. Or bruising ?Lymph: Cervical, supraclavicular normal. ?Neurologic: Grossly non-focal ?Pertinent Labs ?Lab Results ?CBC ?   ?Component Value Date/Time  ? WBC 12.5 (H) 03/25/2022 0510  ? RBC 4.76 03/25/2022 0510  ? HGB 13.1 03/25/2022 0510  ? HCT 39.3  03/25/2022 0510  ? PLT 186 03/25/2022 0510  ? MCV 82.6 03/25/2022 0510  ? MCH 27.5 03/25/2022 0510  ? MCHC 33.3 03/25/2022 0510  ? RDW 13.9 03/25/2022 0510  ? LYMPHSABS 0.6 (L) 03/24/2022 0112  ? MONOABS 0.8 03/24/2022 0

## 2022-03-26 NOTE — Progress Notes (Signed)
Pharmacy Antibiotic Note ? ?Brad [name] is a 46 y.o. male admitted on 03/24/2022 with Sepsis secondary to cellulitis. Pharmacy has been consulted for Vancomycin and Cefepime dosing. ? ?Renal function has improved back to baseline, SCR 1.38>0.87 ? ?Plan: ?Continue Cefepime 2g IV every 8 hours  ? ?Will adjust Vancomycin to '1750mg'$  IV every 12 hours.  ?Goal AUC 400-550. ?Expected AUC: 442 ?SCr used: 0.87 ? ? ?Height: '5\' 8"'$  (172.7 cm) ?Weight: (!) 149.7 kg (330 lb) ?IBW/kg (Calculated) : 68.4 ? ?Temp (24hrs), Avg:98.3 ?F (36.8 ?C), Min:97.8 ?F (36.6 ?C), Max:98.6 ?F (37 ?C) ? ?Recent Labs  ?Lab 03/24/22 ?0112 03/24/22 ?0456 03/24/22 ?0955 03/25/22 ?0510  ?WBC 16.6*  --   --  12.5*  ?CREATININE 1.38*  --   --  0.87  ?LATICACIDVEN 3.3* 2.7* 1.3  --   ? ?  ?Estimated Creatinine Clearance: 153 mL/min (by C-G formula based on SCr of 0.87 mg/dL).   ? ?No Known Allergies ? ?Antimicrobials this admission: ?4/1 Ceftriaxone  >>4/2 ?4/1 vancomycin  >>  ?4/2 Cefepime>> ? ?Microbiology results: ?4/2 UCx: NGF ?4/1 Bcx: NGTD ? ?Thank you for allowing pharmacy to be a part of this patient?s care. ? ?Pearla Dubonnet, PharmD ?Clinical Pharmacist ?03/26/2022 ?10:51 AM ? ? ? ?

## 2022-03-26 NOTE — Progress Notes (Signed)
?PROGRESS NOTE ? ? ? ?Brad [name] [name]  GYF:[id number] DOB: 1976/09/12 DOA: 03/24/2022 ?PCP: Olin Hauser, DO  ? ? ?Brief Narrative:  ?Brad [name] is a 46 y.o. male with medical history significant for morbid obesity, chronic venous insufficiency, hypertension who presents to the ER for evaluation of bilateral leg swelling and redness. ?Patient states that he has chronic bilateral lower extremity swelling but that has worsened over the last couple of days and he also has significant redness involving his right lower extremity with associated pain.  He was noted to be febrile in the ER with a Tmax of 101.3. ?He denies having any chest pain, no shortness of breath, no nausea, no vomiting, no changes in his bowel habits, no urinary symptoms, no dizziness, no lightheadedness, no headache, no blurred vision or focal deficit. ? ?4/2 does not feel leg redness has changed.  ?4/3 RLE eyrthema about the same. Rec. For pt to keep legs elevated. Also told pt to avoid foods or drinks with high sugar content. Consulted ID  ? ?Consultants:  ? ? ?Procedures:  ? ?Antimicrobials:  ?Cefepime, vanco ?  ? ? ?Subjective: ?Feels still with right lower extremity edema and no change in the status.  No shortness of breath, chest pain or diarrhea. ? ?Objective: ?Vitals:  ? 03/25/22 1548 03/25/22 1936 03/26/22 0422 03/26/22 0736  ?BP: 140/75 (!) 149/77 127/69 (!) 165/62  ?Pulse: 99 100 86 98  ?Resp: '18 19 20 19  '$ ?Temp: 98.6 ?F (37 ?C) 98.4 ?F (36.9 ?C) 97.8 ?F (36.6 ?C) 98.2 ?F (36.8 ?C)  ?TempSrc:  Oral Oral   ?SpO2: 100% 99% 96% 100%  ?Weight:      ?Height:      ? ? ?Intake/Output Summary (Last 24 hours) at 03/26/2022 0816 ?Last data filed at 03/26/2022 0340 ?Gross per 24 hour  ?Intake 760 ml  ?Output 1400 ml  ?Net -640 ml  ? ?Filed Weights  ? 03/24/22 0102  ?Weight: (!) 149.7 kg  ? ? ?Examination: ?Calm, NAD ?Cta no w/r ?Reg s1/s2 no gallop ?Soft benign +bs ?Lateral lower extremity edema, right worse than left.  Erythema  little bit past the marker.  Erythema extending all the way to the inner thigh and outer thigh.  Warm to touch ?Aaoxox3  ?Mood and affect appropriate in current setting  ? ? ?Data Reviewed: I have personally reviewed following labs and imaging studies ? ?CBC: ?Recent Labs  ?Lab 03/24/22 ?0112 03/25/22 ?0510  ?WBC 16.6* 12.5*  ?NEUTROABS 15.0*  --   ?HGB 15.2 13.1  ?HCT 47.8 39.3  ?MCV 83.4 82.6  ?PLT 242 186  ? ?Basic Metabolic Panel: ?Recent Labs  ?Lab 03/24/22 ?0112 03/24/22 ?1037 03/25/22 ?0510  ?NA 137  --  131*  ?K 3.2*  --  2.9*  ?CL 95*  --  95*  ?CO2 27  --  26  ?GLUCOSE 134*  --  114*  ?BUN 22*  --  18  ?CREATININE 1.38*  --  0.87  ?CALCIUM 8.4*  --  7.8*  ?MG  --  1.6*  --   ? ?GFR: ?Estimated Creatinine Clearance: 153 mL/min (by C-G formula based on SCr of 0.87 mg/dL). ?Liver Function Tests: ?Recent Labs  ?Lab 03/24/22 ?0112  ?AST 39  ?ALT 26  ?ALKPHOS 45  ?BILITOT 0.8  ?PROT 8.1  ?ALBUMIN 3.3*  ? ?Recent Labs  ?Lab 03/24/22 ?0112  ?LIPASE 20  ? ?No results for input(s): AMMONIA in the last 168 hours. ?Coagulation Profile: ?Recent Labs  ?  Lab 03/24/22 ?0112 03/25/22 ?0510  ?INR 1.4* 1.1  ? ?Cardiac Enzymes: ?No results for input(s): CKTOTAL, CKMB, CKMBINDEX, TROPONINI in the last 168 hours. ?BNP (last 3 results) ?No results for input(s): PROBNP in the last 8760 hours. ?HbA1C: ?No results for input(s): HGBA1C in the last 72 hours. ?CBG: ?No results for input(s): GLUCAP in the last 168 hours. ?Lipid Profile: ?No results for input(s): CHOL, HDL, LDLCALC, TRIG, CHOLHDL, LDLDIRECT in the last 72 hours. ?Thyroid Function Tests: ?No results for input(s): TSH, T4TOTAL, FREET4, T3FREE, THYROIDAB in the last 72 hours. ?Anemia Panel: ?No results for input(s): VITAMINB12, FOLATE, FERRITIN, TIBC, IRON, RETICCTPCT in the last 72 hours. ?Sepsis Labs: ?Recent Labs  ?Lab 03/24/22 ?0112 03/24/22 ?0456 03/24/22 ?0955 03/25/22 ?0510  ?PROCALCITON  --   --   --  5.72  ?LATICACIDVEN 3.3* 2.7* 1.3  --   ? ? ?Recent Results (from  the past 240 hour(s))  ?Resp Panel by RT-PCR (Flu A&B, Covid) Nasopharyngeal Swab     Status: None  ? Collection Time: 03/24/22  3:09 AM  ? Specimen: Nasopharyngeal Swab; Nasopharyngeal(NP) swabs in vial transport medium  ?Result Value Ref Range Status  ? SARS Coronavirus 2 by RT PCR NEGATIVE NEGATIVE Final  ?  Comment: (NOTE) ?SARS-CoV-2 target nucleic acids are NOT DETECTED. ? ?The SARS-CoV-2 RNA is generally detectable in upper respiratory ?specimens during the acute phase of infection. The lowest ?concentration of SARS-CoV-2 viral copies this assay can detect is ?138 copies/mL. A negative result does not preclude SARS-Cov-2 ?infection and should not be used as the sole basis for treatment or ?other patient management decisions. A negative result may occur with  ?improper specimen collection/handling, submission of specimen other ?than nasopharyngeal swab, presence of viral mutation(s) within the ?areas targeted by this assay, and inadequate number of viral ?copies(<138 copies/mL). A negative result must be combined with ?clinical observations, patient history, and epidemiological ?information. The expected result is Negative. ? ?Fact Sheet for Patients:  ?EntrepreneurPulse.com.au ? ?Fact Sheet for Healthcare Providers:  ?IncredibleEmployment.be ? ?This test is no t yet approved or cleared by the [place] FDA and  ?has been authorized for detection and/or diagnosis of SARS-CoV-2 by ?FDA under an Emergency Use Authorization (EUA). This EUA will remain  ?in effect (meaning this test can be used) for the duration of the ?COVID-19 declaration under Section 564(b)(1) of the Act, 21 ?U.S.C.section 360bbb-3(b)(1), unless the authorization is terminated  ?or revoked sooner.  ? ? ?  ? Influenza A by PCR NEGATIVE NEGATIVE Final  ? Influenza B by PCR NEGATIVE NEGATIVE Final  ?  Comment: (NOTE) ?The Xpert Xpress SARS-CoV-2/FLU/RSV plus assay is intended as an aid ?in the diagnosis of  influenza from Nasopharyngeal swab specimens and ?should not be used as a sole basis for treatment. Nasal washings and ?aspirates are unacceptable for Xpert Xpress SARS-CoV-2/FLU/RSV ?testing. ? ?Fact Sheet for Patients: ?EntrepreneurPulse.com.au ? ?Fact Sheet for Healthcare Providers: ?IncredibleEmployment.be ? ?This test is not yet approved or cleared by the [place] FDA and ?has been authorized for detection and/or diagnosis of SARS-CoV-2 by ?FDA under an Emergency Use Authorization (EUA). This EUA will remain ?in effect (meaning this test can be used) for the duration of the ?COVID-19 declaration under Section 564(b)(1) of the Act, 21 U.S.C. ?section 360bbb-3(b)(1), unless the authorization is terminated or ?revoked. ? ?Performed at [facility], [place], ?[place] [address] ?  ?Blood Culture (routine x 2)     Status: None (Preliminary result)  ? Collection  Time: 03/24/22  3:09 AM  ? Specimen: BLOOD  ?Result Value Ref Range Status  ? Specimen Description BLOOD BLOOD RIGHT FOREARM  Final  ? Special Requests   Final  ?  BOTTLES DRAWN AEROBIC AND ANAEROBIC Blood Culture adequate volume  ? Culture   Final  ?  NO GROWTH 2 DAYS ?Performed at [facility], [address] [place], [place] [address] ?  ? Report Status PENDING  Incomplete  ?Urine Culture     Status: None  ? Collection Time: 03/24/22  3:09 AM  ? Specimen: In/Out Cath Urine  ?Result Value Ref Range Status  ? Specimen Description   Final  ?  IN/OUT CATH URINE ?Performed at [facility], [address], [place], [place] [address] ?  ? Special Requests   Final  ?  NONE ?Performed at [facility], [address], [place], [place] [address] ?  ? Culture   Final  ?  NO GROWTH ?Performed at [facility], [place] [address], [place], [place] [address] ?  ? Report Status 03/25/2022 FINAL  Final  ?  ? ? ? ? ? ?Radiology Studies: ?US Venous Img Lower Unilateral Left  (DVT) ? ?Result Date: 03/25/2022 ?CLINICAL DATA:  Edema x2 days, pain EXAM: Choose 2 LOWER EXTREMITY VENOUS DOPPLER ULTRASOUND TECHNIQUE: Gray-scale sonography with compression, as well as color and duplex

## 2022-03-27 DIAGNOSIS — L03115 Cellulitis of right lower limb: Secondary | ICD-10-CM | POA: Diagnosis not present

## 2022-03-27 DIAGNOSIS — L0390 Cellulitis, unspecified: Secondary | ICD-10-CM | POA: Diagnosis not present

## 2022-03-27 DIAGNOSIS — A419 Sepsis, unspecified organism: Secondary | ICD-10-CM | POA: Diagnosis not present

## 2022-03-27 LAB — POTASSIUM: Potassium: 3.4 mmol/L — ABNORMAL LOW (ref 3.5–5.1)

## 2022-03-27 MED ORDER — POTASSIUM CHLORIDE CRYS ER 20 MEQ PO TBCR
40.0000 meq | EXTENDED_RELEASE_TABLET | Freq: Once | ORAL | Status: AC
  Administered 2022-03-27: 40 meq via ORAL
  Filled 2022-03-27: qty 2

## 2022-03-27 NOTE — Progress Notes (Signed)
?PROGRESS NOTE ? ? ? ?Brad [name] [name]  UYQ:[id number] DOB: [birth year] DOA: 03/24/2022 ?PCP: Olin Hauser, DO  ? ? ?Brief Narrative:  ?Brad [name] is a 46 y.o. male with medical history significant for morbid obesity, chronic venous insufficiency, hypertension who presents to the ER for evaluation of bilateral leg swelling and redness. ?Patient states that he has chronic bilateral lower extremity swelling but that has worsened over the last couple of days and he also has significant redness involving his right lower extremity with associated pain.  He was noted to be febrile in the ER with a Tmax of 101.3. ? ? ?Found with cellulitis likely due to strep.  ID consulted.  On IV antibiotics.  Still needs a few days of IV antibiotics due to severity of his cellulitis. ? ? ?Consultants:  ?ID ? ?Procedures:  ? ?Antimicrobials:  ?Cefepime, vanco...>4/3 ?Cefazolin 4/3..>>>>> ?  ? ? ?Subjective: ?No sob, cp, chills, or any other complaints. ? ?Objective: ?Vitals:  ? 03/26/22 1509 03/26/22 1929 03/27/22 0406 03/27/22 0741  ?BP: (!) 146/73 (!) 159/72 (!) 141/66 125/66  ?Pulse: 94 (!) 102 98 98  ?Resp: '20 16 20 20  '$ ?Temp: 98.4 ?F (36.9 ?C) 98.1 ?F (36.7 ?C) 98.4 ?F (36.9 ?C) 98.7 ?F (37.1 ?C)  ?TempSrc:      ?SpO2: 97% 96% 96% 94%  ?Weight:      ?Height:      ? ? ?Intake/Output Summary (Last 24 hours) at 03/27/2022 0809 ?Last data filed at 03/27/2022 0500 ?Gross per 24 hour  ?Intake 1160 ml  ?Output 3425 ml  ?Net -2265 ml  ? ?Filed Weights  ? 03/24/22 0102  ?Weight: (!) 149.7 kg  ? ? ?Examination: ?Calm, NAD ?Cta no w/r ?Reg s1/s2 no gallop ?Soft benign +bs ?RLE edema, mildly down. Erythema still same, minimal regression. Warm to touch. ?Aaoxox3  ?Mood and affect appropriate in current setting  ? ? ?Data Reviewed: I have personally reviewed following labs and imaging studies ? ?CBC: ?Recent Labs  ?Lab 03/24/22 ?0112 03/25/22 ?0510  ?WBC 16.6* 12.5*  ?NEUTROABS 15.0*  --   ?HGB 15.2 13.1  ?HCT 47.8 39.3  ?MCV  83.4 82.6  ?PLT 242 186  ? ?Basic Metabolic Panel: ?Recent Labs  ?Lab 03/24/22 ?0112 03/24/22 ?1037 03/25/22 ?0510 03/26/22 ?6387 03/27/22 ?5643  ?NA 137  --  131* 134*  --   ?K 3.2*  --  2.9* 3.1* 3.4*  ?CL 95*  --  95*  --   --   ?CO2 27  --  26  --   --   ?GLUCOSE 134*  --  114*  --   --   ?BUN 22*  --  18  --   --   ?CREATININE 1.38*  --  0.87  --   --   ?CALCIUM 8.4*  --  7.8*  --   --   ?MG  --  1.6*  --   --   --   ? ?GFR: ?Estimated Creatinine Clearance: 153 mL/min (by C-G formula based on SCr of 0.87 mg/dL). ?Liver Function Tests: ?Recent Labs  ?Lab 03/24/22 ?0112  ?AST 39  ?ALT 26  ?ALKPHOS 45  ?BILITOT 0.8  ?PROT 8.1  ?ALBUMIN 3.3*  ? ?Recent Labs  ?Lab 03/24/22 ?0112  ?LIPASE 20  ? ?No results for input(s): AMMONIA in the last 168 hours. ?Coagulation Profile: ?Recent Labs  ?Lab 03/24/22 ?0112 03/25/22 ?0510  ?INR 1.4* 1.1  ? ?Cardiac Enzymes: ?No results for input(s): CKTOTAL,  CKMB, CKMBINDEX, TROPONINI in the last 168 hours. ?BNP (last 3 results) ?No results for input(s): PROBNP in the last 8760 hours. ?HbA1C: ?No results for input(s): HGBA1C in the last 72 hours. ?CBG: ?No results for input(s): GLUCAP in the last 168 hours. ?Lipid Profile: ?No results for input(s): CHOL, HDL, LDLCALC, TRIG, CHOLHDL, LDLDIRECT in the last 72 hours. ?Thyroid Function Tests: ?No results for input(s): TSH, T4TOTAL, FREET4, T3FREE, THYROIDAB in the last 72 hours. ?Anemia Panel: ?No results for input(s): VITAMINB12, FOLATE, FERRITIN, TIBC, IRON, RETICCTPCT in the last 72 hours. ?Sepsis Labs: ?Recent Labs  ?Lab 03/24/22 ?0112 03/24/22 ?0456 03/24/22 ?0955 03/25/22 ?0510  ?PROCALCITON  --   --   --  5.72  ?LATICACIDVEN 3.3* 2.7* 1.3  --   ? ? ?Recent Results (from the past 240 hour(s))  ?Resp Panel by RT-PCR (Flu A&B, Covid) Nasopharyngeal Swab     Status: None  ? Collection Time: 03/24/22  3:09 AM  ? Specimen: Nasopharyngeal Swab; Nasopharyngeal(NP) swabs in vial transport medium  ?Result Value Ref Range Status  ? SARS  Coronavirus 2 by RT PCR NEGATIVE NEGATIVE Final  ?  Comment: (NOTE) ?SARS-CoV-2 target nucleic acids are NOT DETECTED. ? ?The SARS-CoV-2 RNA is generally detectable in upper respiratory ?specimens during the acute phase of infection. The lowest ?concentration of SARS-CoV-2 viral copies this assay can detect is ?138 copies/mL. A negative result does not preclude SARS-Cov-2 ?infection and should not be used as the sole basis for treatment or ?other patient management decisions. A negative result may occur with  ?improper specimen collection/handling, submission of specimen other ?than nasopharyngeal swab, presence of viral mutation(s) within the ?areas targeted by this assay, and inadequate number of viral ?copies(<138 copies/mL). A negative result must be combined with ?clinical observations, patient history, and epidemiological ?information. The expected result is Negative. ? ?Fact Sheet for Patients:  ?EntrepreneurPulse.com.au ? ?Fact Sheet for Healthcare Providers:  ?IncredibleEmployment.be ? ?This test is no t yet approved or cleared by the [place] FDA and  ?has been authorized for detection and/or diagnosis of SARS-CoV-2 by ?FDA under an Emergency Use Authorization (EUA). This EUA will remain  ?in effect (meaning this test can be used) for the duration of the ?COVID-19 declaration under Section 564(b)(1) of the Act, 21 ?U.S.C.section 360bbb-3(b)(1), unless the authorization is terminated  ?or revoked sooner.  ? ? ?  ? Influenza A by PCR NEGATIVE NEGATIVE Final  ? Influenza B by PCR NEGATIVE NEGATIVE Final  ?  Comment: (NOTE) ?The Xpert Xpress SARS-CoV-2/FLU/RSV plus assay is intended as an aid ?in the diagnosis of influenza from Nasopharyngeal swab specimens and ?should not be used as a sole basis for treatment. Nasal washings and ?aspirates are unacceptable for Xpert Xpress SARS-CoV-2/FLU/RSV ?testing. ? ?Fact Sheet for  Patients: ?EntrepreneurPulse.com.au ? ?Fact Sheet for Healthcare Providers: ?IncredibleEmployment.be ? ?This test is not yet approved or cleared by the [place] FDA and ?has been authorized for detection and/or diagnosis of SARS-CoV-2 by ?FDA under an Emergency Use Authorization (EUA). This EUA will remain ?in effect (meaning this test can be used) for the duration of the ?COVID-19 declaration under Section 564(b)(1) of the Act, 21 U.S.C. ?section 360bbb-3(b)(1), unless the authorization is terminated or ?revoked. ? ?Performed at [facility], [place], ?[place] [address] ?  ?Blood Culture (routine x 2)     Status: None (Preliminary result)  ? Collection Time: 03/24/22  3:09 AM  ? Specimen: BLOOD  ?Result Value Ref Range Status  ? Specimen  Description BLOOD BLOOD RIGHT FOREARM  Final  ? Special Requests   Final  ?  BOTTLES DRAWN AEROBIC AND ANAEROBIC Blood Culture adequate volume  ? Culture   Final  ?  NO GROWTH 3 DAYS ?Performed at [facility], [address] [place], [place] [address] ?  ? Report Status PENDING  Incomplete  ?Urine Culture     Status: None  ? Collection Time: 03/24/22  3:09 AM  ? Specimen: In/Out Cath Urine  ?Result Value Ref Range Status  ? Specimen Description   Final  ?  IN/OUT CATH URINE ?Performed at [facility], [address] [place], [place] [address] ?  ? Special Requests   Final  ?  NONE ?Performed at [facility], [address] [place], [place] [address] ?  ? Culture   Final  ?  NO GROWTH ?Performed at [facility], [place] [address], [place], [place] [address] ?  ? Report Status 03/25/2022 FINAL  Final  ?Culture, blood (Routine X 2) w Reflex to ID Panel     Status: None (Preliminary result)  ? Collection Time: 03/26/22  7:17 PM  ? Specimen: BLOOD  ?Result Value Ref Range Status  ? Specimen Description BLOOD BLOOD RIGHT HAND  Final  ? Special Requests   Final  ?  BOTTLES DRAWN AEROBIC  AND ANAEROBIC Blood Culture adequate volume  ? Culture   Final  ?  NO GROWTH < 12 HOURS ?Performed at [facility], [address] [place],  [address] ?  ? Report Status PENDING  Incomplete  ?  ? ? ? ? ? ?Radiology Studies: ?US Venous Img

## 2022-03-27 NOTE — Progress Notes (Signed)
? ?Date of Admission:  03/24/2022    ? ?ID: Brad [name] is a 46 y.o. male Principal Problem: ?  Sepsis due to cellulitis [facility]) ?Active Problems: ?  Essential hypertension ?  Morbid obesity with BMI of 50.0-59.9, adult ([place]) ?  Hypokalemia ?  Dehydration ? ? ? ?Subjective: ?Doing better ?Rt leg less swollen ?Less red ?Less painful ? ?Medications:  ? acidophilus  1 capsule Oral Daily  ? amLODipine  2.5 mg Oral Daily  ? amphetamine-dextroamphetamine  25 mg Oral q morning  ? buprenorphine-naloxone  1 tablet Sublingual Daily  ? enoxaparin (LOVENOX) injection  0.5 mg/kg Subcutaneous Q24H  ? nicotine  14 mg Transdermal Daily  ? ? ?Objective: ?Vital signs in last 24 hours: ?Patient Vitals for the past 24 hrs: ? BP Temp Pulse Resp SpO2  ?03/27/22 0741 125/66 98.7 ?F (37.1 ?C) 98 20 94 %  ?03/27/22 0406 (!) 141/66 98.4 ?F (36.9 ?C) 98 20 96 %  ?03/26/22 1929 (!) 159/72 98.1 ?F (36.7 ?C) (!) 102 16 96 %  ?  ?PHYSICAL EXAM:  ?General: Alert, cooperative, no distress, appears stated age.  ?Head: Normocephalic, without obvious abnormality, atraumatic. ?Eyes: Conjunctivae clear, anicteric sclerae. Pupils are equal ?ENT Nares normal. No drainage or sinus tenderness. ?Lips, mucosa, and tongue normal. No Thrush ?Neck: Supple, symmetrical, no adenopathy, thyroid: non tender ?no carotid bruit and no JVD. ?Back: No CVA tenderness. ?Lungs: Clear to auscultation bilaterally. No Wheezing or Rhonchi. No rales. ?Heart: Regular rate and rhythm, no murmur, rub or gallop. ?Abdomen: Soft, non-tender,not distended. Bowel sounds normal. No masses ?Extremities: rt leg edmeatous ?Erthematous ?Superficial blisters calf ?Skin: No rashes or lesions. Or bruising ?Lymph: Cervical, supraclavicular normal. ?Neurologic: Grossly non-focal ? ?Lab Results ?Recent Labs  ?  03/25/22 ?0510 03/26/22 ?4782 03/27/22 ?9562  ?WBC 12.5*  --   --   ?HGB 13.1  --   --   ?HCT 39.3  --   --   ?NA 131* 134*  --   ?K 2.9* 3.1* 3.4*  ?CL 95*  --   --   ?CO2 26  --    --   ?BUN 18  --   --   ?CREATININE 0.87  --   --   ? ?Liver Panel ?No results for input(s): PROT, ALBUMIN, AST, ALT, ALKPHOS, BILITOT, BILIDIR, IBILI in the last 72 hours. ?Sedimentation Rate ?No results for input(s): ESRSEDRATE in the last 72 hours. ?C-Reactive Protein ?No results for input(s): CRP in the last 72 hours. ? ?Microbiology: ? ?Studies/Results: ?ECHOCARDIOGRAM COMPLETE ? ?Result Date: 03/26/2022 ?   ECHOCARDIOGRAM REPORT   Patient Name:   Brad [name] Date of Exam: 03/26/2022 Medical Rec #:  [id number]             Height:       68.0 in Accession #:    [id number]            Weight:       330.0 lb Date of Birth:  August 18, 1976             BSA:          2.528 m? Patient Age:    46 years              BP:           165/62 mmHg Patient Gender: M                     HR:  100 bpm. Exam Location:  [facility] Procedure: 2D Echo, Cardiac Doppler, Color Doppler and Intracardiac            Opacification Agent Indications:     I33.9 SBE  History:         Patient has no prior history of Echocardiogram examinations.                  Risk Factors:Hypertension.  Sonographer:     Rosalia Hammers Referring Phys:  [id number] [place] Diagnosing Phys: Kathlyn Sacramento MD  Sonographer Comments: Suboptimal apical window. Image acquisition challenging due to patient body habitus and Image acquisition challenging due to respiratory motion. IMPRESSIONS  1. Left ventricular ejection fraction, by estimation, is 55 to 60%. The left ventricle has normal function. The left ventricle has no regional wall motion abnormalities. Left ventricular diastolic parameters were normal.  2. Right ventricular systolic function is normal. The right ventricular size is normal. Tricuspid regurgitation signal is inadequate for assessing PA pressure.  3. The mitral valve is normal in structure. No evidence of mitral valve regurgitation. No evidence of mitral stenosis.  4. The aortic valve is normal in structure. Aortic valve regurgitation is not  visualized. No aortic stenosis is present. FINDINGS  Left Ventricle: Left ventricular ejection fraction, by estimation, is 55 to 60%. The left ventricle has normal function. The left ventricle has no regional wall motion abnormalities. Definity contrast agent was given IV to delineate the left ventricular  endocardial borders. The left ventricular internal cavity size was normal in size. There is no left ventricular hypertrophy. Left ventricular diastolic parameters were normal. Right Ventricle: The right ventricular size is normal. No increase in right ventricular wall thickness. Right ventricular systolic function is normal. Tricuspid regurgitation signal is inadequate for assessing PA pressure. Left Atrium: Left atrial size was normal in size. Right Atrium: Right atrial size was normal in size. Pericardium: There is no evidence of pericardial effusion. Mitral Valve: The mitral valve is normal in structure. No evidence of mitral valve regurgitation. No evidence of mitral valve stenosis. MV peak gradient, 2.3 mmHg. The mean mitral valve gradient is 1.0 mmHg. Tricuspid Valve: The tricuspid valve is normal in structure. Tricuspid valve regurgitation is not demonstrated. No evidence of tricuspid stenosis. Aortic Valve: The aortic valve is normal in structure. Aortic valve regurgitation is not visualized. No aortic stenosis is present. Aortic valve mean gradient measures 6.0 mmHg. Aortic valve peak gradient measures 9.6 mmHg. Aortic valve area, by VTI measures 4.03 cm?. Pulmonic Valve: The pulmonic valve was normal in structure. Pulmonic valve regurgitation is not visualized. No evidence of pulmonic stenosis. Aorta: The aortic root is normal in size and structure. Venous: The inferior vena cava was not well visualized. IAS/Shunts: No atrial level shunt detected by color flow Doppler.  LEFT VENTRICLE PLAX 2D LVIDd:         4.14 cm      Diastology LVIDs:         3.50 cm      LV e' medial:    14.40 cm/s LV PW:          0.86 cm      LV E/e' medial:  5.5 LV IVS:        1.13 cm      LV e' lateral:   11.00 cm/s LVOT diam:     2.50 cm      LV E/e' lateral: 7.2 LV SV:         111 LV SV Index:  44 LVOT Area:     4.91 cm?  LV Volumes (MOD) LV vol d, MOD A2C: 134.0 ml LV vol d, MOD A4C: 164.0 ml LV vol s, MOD A2C: 43.3 ml LV vol s, MOD A4C: 55.9 ml LV SV MOD A2C:     90.7 ml LV SV MOD A4C:     164.0 ml LV SV MOD BP:      102.9 ml RIGHT VENTRICLE RV Basal diam:  3.99 cm LEFT ATRIUM             Index        RIGHT ATRIUM           Index LA diam:        4.10 cm 1.62 cm/m?   RA Area:     24.20 cm? LA Vol (A2C):   34.7 ml 13.72 ml/m?  RA Volume:   74.70 ml  29.54 ml/m? LA Vol (A4C):   25.9 ml 10.24 ml/m? LA Biplane Vol: 30.6 ml 12.10 ml/m?  AORTIC VALVE                     PULMONIC VALVE AV Area (Vmax):    3.71 cm?      PV Vmax:       1.55 m/s AV Area (Vmean):   3.16 cm?      PV Vmean:      107.000 cm/s AV Area (VTI):     4.03 cm?      PV VTI:        0.211 m AV Vmax:           155.00 cm/s   PV Peak grad:  9.6 mmHg AV Vmean:          118.000 cm/s  PV Mean grad:  5.0 mmHg AV VTI:            0.275 m AV Peak Grad:      9.6 mmHg AV Mean Grad:      6.0 mmHg LVOT Vmax:         117.00 cm/s LVOT Vmean:        75.900 cm/s LVOT VTI:          0.226 m LVOT/AV VTI ratio: 0.82  AORTA Ao Root diam: 3.60 cm MITRAL VALVE MV Area (PHT): 5.84 cm?    SHUNTS MV Area VTI:   6.73 cm?    Systemic VTI:  0.23 m MV Peak grad:  2.3 mmHg    Systemic Diam: 2.50 cm MV Mean grad:  1.0 mmHg MV Vmax:       0.76 m/s MV Vmean:      57.4 cm/s MV Decel Time: 130 msec MV E velocity: 79.60 cm/s MV A velocity: 63.00 cm/s MV E/A ratio:  1.26 Kathlyn Sacramento MD Electronically signed by Kathlyn Sacramento MD Signature Date/Time: 03/26/2022/2:31:54 PM    Final    ? ? ?Assessment/Plan: ?Rt leg cellulitis with severe venous edema ?On cefazolin + clindamycin ?Improving ?Keep the leg elevated on 4 pillows ? ?BMI > 50- discussed diet, exercise and to avoid sugar ? ?HTN - management as per primary  team ? ? ?  ?

## 2022-03-28 DIAGNOSIS — N179 Acute kidney failure, unspecified: Secondary | ICD-10-CM

## 2022-03-28 DIAGNOSIS — A419 Sepsis, unspecified organism: Secondary | ICD-10-CM | POA: Diagnosis not present

## 2022-03-28 DIAGNOSIS — E876 Hypokalemia: Secondary | ICD-10-CM

## 2022-03-28 DIAGNOSIS — L0390 Cellulitis, unspecified: Secondary | ICD-10-CM | POA: Diagnosis not present

## 2022-03-28 DIAGNOSIS — L03119 Cellulitis of unspecified part of limb: Secondary | ICD-10-CM

## 2022-03-28 DIAGNOSIS — Z6841 Body Mass Index (BMI) 40.0 and over, adult: Secondary | ICD-10-CM

## 2022-03-28 LAB — CBC
HCT: 35.9 % — ABNORMAL LOW (ref 39.0–52.0)
Hemoglobin: 11.8 g/dL — ABNORMAL LOW (ref 13.0–17.0)
MCH: 26.5 pg (ref 26.0–34.0)
MCHC: 32.9 g/dL (ref 30.0–36.0)
MCV: 80.7 fL (ref 80.0–100.0)
Platelets: 293 10*3/uL (ref 150–400)
RBC: 4.45 MIL/uL (ref 4.22–5.81)
RDW: 14.3 % (ref 11.5–15.5)
WBC: 16.8 10*3/uL — ABNORMAL HIGH (ref 4.0–10.5)
nRBC: 0 % (ref 0.0–0.2)

## 2022-03-28 LAB — PROCALCITONIN: Procalcitonin: 0.68 ng/mL

## 2022-03-28 NOTE — Progress Notes (Signed)
ID ?Pt doing better ?Says left leg swelling is better ?Still has pain when he bears weight on the leg ?No fever ? ?O/e awake and alert ?Patient Vitals for the past 24 hrs: ? BP Temp Temp src Pulse Resp SpO2  ?03/28/22 1612 (!) 154/69 98.6 ?F (37 ?C) -- 97 18 96 %  ?03/28/22 0724 130/74 98.6 ?F (37 ?C) -- 98 18 95 %  ?03/28/22 0504 132/65 98.7 ?F (37.1 ?C) Oral 96 20 93 %  ?03/27/22 2104 (!) 148/73 99.6 ?F (37.6 ?C) Oral (!) 102 18 97 %  ? Chest CTA ?Hss1s2 ?Abd soft ?Rt leg less swollen ?Erythema thigh fading ?Shin violaceous discoloration ?Superficial flattened blister over calf ? ? ? ? ? ? ? ? ?Labs ? ?  Latest Ref Rng & Units 03/28/2022  ?  9:01 AM 03/25/2022  ?  5:10 AM 03/24/2022  ?  1:12 AM  ?CBC  ?WBC 4.0 - 10.5 K/uL 16.8   12.5   16.6    ?Hemoglobin 13.0 - 17.0 g/dL 11.8   13.1   15.2    ?Hematocrit 39.0 - 52.0 % 35.9   39.3   47.8    ?Platelets 150 - 400 K/uL 293   186   242    ?  ? ? ?  Latest Ref Rng & Units 03/27/2022  ?  6:22 AM 03/26/2022  ?  9:11 AM 03/25/2022  ?  5:10 AM  ?CMP  ?Glucose 70 - 99 mg/dL   114    ?BUN 6 - 20 mg/dL   18    ?Creatinine 0.61 - 1.24 mg/dL   0.87    ?Sodium 135 - 145 mmol/L  134   131    ?Potassium 3.5 - 5.1 mmol/L 3.4   3.1   2.9    ?Chloride 98 - 111 mmol/L   95    ?CO2 22 - 32 mmol/L   26    ?Calcium 8.9 - 10.3 mg/dL   7.8    ?  ? ?Micro ?BC- NG on 4/1 and 4/3 ? ?Impression/recommendation ?Severe rt leg cellulitis-  improving ?On cefazolin and clinda IV- can DC the latter ?Keep the leg elevated n 4 pillows ? ?Leucocytosis worsenign but no fever and leg improving- if it continues to go up, will need imaging of the rt leg to look for any soft tissue collection ? ?Morbid obesity- discussed diet and exercise after discharge ? ?Discussed the management with patient and pharmacist ? ?

## 2022-03-28 NOTE — Progress Notes (Signed)
[facility] Progress Note ? ?Patient: Brad [name]    RKY:[id number]  DOA: 03/24/2022    ?Date of Service: the patient was seen and examined on 03/28/2022 ? ?Brief hospital course: ?46 year old male with past medical history of morbid obesity and hypertension admitted on 4/1 for lower extremity cellulitis requiring IV antibiotics.  Infectious disease consulted.   ? ?Assessment and Plan: ?Assessment and Plan: ?* Sepsis due to cellulitis [facility]) ?As evidenced by fever with a Tmax of 101.3, tachycardia, leukocytosis, cellulitis and lactic acidosis.  Status post IV fluids and asked.  Sepsis now resolved.  Initially on IV Rocephin and changed over to cefepime and vancomycin 4/3 when look to be worsening and then Ancef and clindamycin for suspected Streptococcus.  Patient looks to be making some improvement although still some significant redness of right lower extremity.  Procalcitonin today still elevated, but much improved compared to on admission. ? ?Essential hypertension ?Hold hydrochlorothiazide due to dehydration ?We will place patient on amlodipine for blood pressure control ? ?Morbid obesity with BMI of 50.0-59.9, adult ([place]) ?Complicates overall prognosis and care ?Lifestyle modification and exercise has been discussed with patient in detail ? ?AKI (acute kidney injury) ([place]) ?AKI resolved with IV fluids, secondary to diuretic use and poor oral intake ?Patient has a baseline serum creatinine of 0.88 and on admission it is 1.38.  Creatinine by 4/2 back to normal. ? ?Hypokalemia ?Secondary to hydrochlorothiazide use and then following normal saline.  Replacing potassium as needed. ? ? ? ? ? ? ?Body mass index is 50.18 kg/m?.  ?  ?   ? ?Consultants: ?Infectious disease ? ?Procedures: ?None ? ?Antimicrobials: ?IV Rocephin 4/1-4/2 ?IV cefepime and vancomycin 4/2-4/3 ?IV clindamycin and IV Ancef 4/3-present ? ?Code Status: Full code ? ? ?Subjective: Patient doing okay, complains of some right lower extremity  swelling and discomfort although much improved from previous day ? ?Objective: ?Vital signs were reviewed and unremarkable. ?Vitals:  ? 03/28/22 0504 03/28/22 0724  ?BP: 132/65 130/74  ?Pulse: 96 98  ?Resp: 20 18  ?Temp: 98.7 ?F (37.1 ?C) 98.6 ?F (37 ?C)  ?SpO2: 93% 95%  ? ? ?Intake/Output Summary (Last 24 hours) at 03/28/2022 1443 ?Last data filed at 03/28/2022 1412 ?Gross per 24 hour  ?Intake 680 ml  ?Output 1200 ml  ?Net -520 ml  ? ?Filed Weights  ? 03/24/22 0102  ?Weight: (!) 149.7 kg  ? ?Body mass index is 50.18 kg/m?. ? ?Exam: ? ?General: Alert and oriented x3, no acute distress ?HEENT: Normocephalic, atraumatic, mucous membranes are moist ?Cardiovascular: Regular rate and rhythm, S1-S2 ?Respiratory: Clear to auscultation bilaterally ?Abdomen: Soft, obese, nontender, positive bowel sounds ?Musculoskeletal: No clubbing or cyanosis, trace pitting edema, right greater than left ?Skin: Significant erythema on the anterior aspect of the right lower extremity, along with some mild chronic venous stasis.  Minimal venous stasis seen on left lower extremity ?Psychiatry: Appropriate, no evidence of psychoses ?Neurology: No focal deficits ? ?Data Reviewed: ?White blood cell count at 16.8, elevated since labs on 4/2.  Procalcitonin however lower at 0.68, down from 5.72 on 4/2 ? ?Disposition:  ?Status is: Inpatient ?Remains inpatient appropriate because: Continued need for IV antibiotics ?  ? ?Anticipated discharge date: 4/6 ? ?Remaining issues to be resolved so that patient can be discharged: Improvement of right lower extremity cellulitis as well as improvement in white blood cell count ? ? ?Family Communication: Left message for wife ?DVT Prophylaxis: ?  Lovenox ? ? ? ?Author: ?Annita Brod ,MD ?03/28/2022  2:43 PM ? ?To reach On-call, see care teams to locate the attending and reach out via [email or web address]. ?Between 7PM-7AM, please contact night-coverage ?If you still have difficulty reaching the attending provider, please  page the [facility] (Director on Call) for [facility] on amion for assistance. ? ?

## 2022-03-28 NOTE — Progress Notes (Signed)
Mobility Specialist - Progress Note ? ? 03/28/22 1300  ?Mobility  ?Range of Motion/Exercises Right leg;Left leg  ?Level of Assistance Standby assist, set-up cues, supervision of patient - no hands on  ?Assistive Device None  ?Distance Ambulated (ft) 0 ft  ?Activity Response Tolerated well  ?$Mobility charge 1 Mobility  ? ? ? ?Pt supine upon arrival using RA. Pt hesitant to ambulate d/t 8/10 pain in Right Leg when weight is applied. Pt agreeable to LE [place]x with supervision Pt voices no complaints throughout session. Educated on importance of circulation. ? ?Brad [name] ?Mobility Specialist ?03/28/22, 1:35 PM ? ? ? ?

## 2022-03-28 NOTE — Hospital Course (Addendum)
46 year old male with past medical history of morbid obesity and hypertension admitted on 4/1 for lower extremity cellulitis requiring IV antibiotics.  Infectious disease consulted.  Patient received IV antibiotics for 1 week.  Cellulitis slow to improve but over time did so.  By 4/8, cellulitis clinically appears better and procalcitonin level close to normal. ?

## 2022-03-29 ENCOUNTER — Inpatient Hospital Stay: Payer: BC Managed Care – PPO

## 2022-03-29 DIAGNOSIS — I10 Essential (primary) hypertension: Secondary | ICD-10-CM | POA: Diagnosis not present

## 2022-03-29 DIAGNOSIS — L0390 Cellulitis, unspecified: Secondary | ICD-10-CM | POA: Diagnosis not present

## 2022-03-29 DIAGNOSIS — N179 Acute kidney failure, unspecified: Secondary | ICD-10-CM | POA: Diagnosis not present

## 2022-03-29 DIAGNOSIS — A419 Sepsis, unspecified organism: Secondary | ICD-10-CM | POA: Diagnosis not present

## 2022-03-29 LAB — CBC
HCT: 35.2 % — ABNORMAL LOW (ref 39.0–52.0)
Hemoglobin: 11.6 g/dL — ABNORMAL LOW (ref 13.0–17.0)
MCH: 26.6 pg (ref 26.0–34.0)
MCHC: 33 g/dL (ref 30.0–36.0)
MCV: 80.7 fL (ref 80.0–100.0)
Platelets: 374 10*3/uL (ref 150–400)
RBC: 4.36 MIL/uL (ref 4.22–5.81)
RDW: 14.4 % (ref 11.5–15.5)
WBC: 17.3 10*3/uL — ABNORMAL HIGH (ref 4.0–10.5)
nRBC: 0 % (ref 0.0–0.2)

## 2022-03-29 LAB — CULTURE, BLOOD (ROUTINE X 2)
Culture: NO GROWTH
Special Requests: ADEQUATE

## 2022-03-29 LAB — PROCALCITONIN: Procalcitonin: 0.57 ng/mL

## 2022-03-29 NOTE — Progress Notes (Signed)
ID ?Pt doing better ?Rt leg dressing removed ? ?O/e awake and alert ?Patient Vitals for the past 24 hrs: ? BP Temp Temp src Pulse Resp SpO2  ?03/29/22 0816 116/62 98.1 ?F (36.7 ?C) Oral 93 18 94 %  ?03/29/22 0427 (!) 121/51 98.4 ?F (36.9 ?C) -- 93 20 93 %  ?03/28/22 1933 132/68 98.7 ?F (37.1 ?C) -- 99 20 94 %  ?03/28/22 1612 (!) 154/69 98.6 ?F (37 ?C) -- 97 18 96 %  ? Chest CTA ?Hss1s2 ?Abd soft ?Rt leg much better-  less swollen, erythema is fading ? ? ? ? ? ? ? ? ? ? ? ? ? ? ? ?Labs ? ?  Latest Ref Rng & Units 03/29/2022  ?  6:34 AM 03/28/2022  ?  9:01 AM 03/25/2022  ?  5:10 AM  ?CBC  ?WBC 4.0 - 10.5 K/uL 17.3   16.8   12.5    ?Hemoglobin 13.0 - 17.0 g/dL 11.6   11.8   13.1    ?Hematocrit 39.0 - 52.0 % 35.2   35.9   39.3    ?Platelets 150 - 400 K/uL 374   293   186    ?  ? ? ?  Latest Ref Rng & Units 03/27/2022  ?  6:22 AM 03/26/2022  ?  9:11 AM 03/25/2022  ?  5:10 AM  ?CMP  ?Glucose 70 - 99 mg/dL   114    ?BUN 6 - 20 mg/dL   18    ?Creatinine 0.61 - 1.24 mg/dL   0.87    ?Sodium 135 - 145 mmol/L  134   131    ?Potassium 3.5 - 5.1 mmol/L 3.4   3.1   2.9    ?Chloride 98 - 111 mmol/L   95    ?CO2 22 - 32 mmol/L   26    ?Calcium 8.9 - 10.3 mg/dL   7.8    ?  ? ?Micro ?BC- NG on 4/1 and 4/3 ? ?Impression/recommendation ?Severe rt leg cellulitis-  improving ?On cefazolin  ?Keep the leg elevated n 4 pillows ? ?Leucocytosis worsenign but no fever and leg much better ? if it continues to go up, will need imaging of the chest to look for any atelectasis/pneumonia ? ?Morbid obesity- discussed diet and exercise after discharge ? ?Discussed the management with patient and hospitalist ? ?

## 2022-03-29 NOTE — Progress Notes (Signed)
[facility] Progress Note ? ?Patient: Brad [name]    PZW:[id number]  DOA: 03/24/2022    ?Date of Service: the patient was seen and examined on 03/29/2022 ? ?Brief hospital course: ?46 year old male with past medical history of morbid obesity and hypertension admitted on 4/1 for lower extremity cellulitis requiring IV antibiotics.  Infectious disease consulted.  Despite IV antibiotics, cellulitis slow to improve.  White blood cell count increasing and procalcitonin level down from mission but still above normal. ? ?Assessment and Plan: ?Assessment and Plan: ?* Sepsis due to cellulitis [facility]) ?As evidenced by fever with a Tmax of 101.3, tachycardia, leukocytosis, cellulitis and lactic acidosis.  Status post IV fluids and asked.  Sepsis now resolved.  Initially on IV Rocephin and changed over to cefepime and vancomycin 4/3 when look to be worsening and then Ancef and clindamycin for suspected Streptococcus.  Erythema still significantly persistent.  Procalcitonin with minimal change from previous day and white blood cell count increasing.  Discussed with infectious disease.  Checking ABIs ? ?Essential hypertension ?Hold hydrochlorothiazide due to dehydration ?We will place patient on amlodipine for blood pressure control ? ?Morbid obesity with BMI of 50.0-59.9, adult ([place]) ?Complicates overall prognosis and care ?Lifestyle modification and exercise has been discussed with patient in detail ? ?AKI (acute kidney injury) ([place]) ?AKI resolved with IV fluids, secondary to diuretic use and poor oral intake ?Patient has a baseline serum creatinine of 0.88 and on admission it is 1.38.  Creatinine by 4/2 back to normal. ? ?Hypokalemia ?Secondary to hydrochlorothiazide use and then following normal saline.  Replacing potassium as needed. ? ? ? ? ? ? ?Body mass index is 50.18 kg/m?.  ?  ?   ? ?Consultants: ?Infectious disease ? ?Procedures: ?None ? ?Antimicrobials: ?IV Rocephin 4/1-4/2 ?IV cefepime and vancomycin  4/2-4/3 ?IV clindamycin and IV Ancef 4/3-present ? ?Code Status: Full code ? ? ?Subjective: Patient okay, still with some swelling of the right lower extremity.  No discomfort unless he hangs his leg over the side of the bed. ? ?Objective: ?Vital signs were reviewed and unremarkable. ?Vitals:  ? 03/29/22 0816 03/29/22 1518  ?BP: 116/62 (!) 141/69  ?Pulse: 93 96  ?Resp: 18 18  ?Temp: 98.1 ?F (36.7 ?C) 98.3 ?F (36.8 ?C)  ?SpO2: 94% 92%  ? ? ?Intake/Output Summary (Last 24 hours) at 03/29/2022 1739 ?Last data filed at 03/29/2022 1416 ?Gross per 24 hour  ?Intake 720 ml  ?Output 3150 ml  ?Net -2430 ml  ? ? ?Filed Weights  ? 03/24/22 0102  ?Weight: (!) 149.7 kg  ? ?Body mass index is 50.18 kg/m?. ? ?Exam: ? ?General: Alert and oriented x3, no acute distress ?HEENT: Normocephalic, atraumatic, mucous membranes are moist ?Cardiovascular: Regular rate and rhythm, S1-S2 ?Respiratory: Clear to auscultation bilaterally ?Abdomen: Soft, obese, nontender, positive bowel sounds ?Musculoskeletal: No clubbing or cyanosis, trace pitting edema, right greater than left.  Leg wrapped. ?Skin: Underneath leg wrapped is persistent erythema on the anterior aspect of the right lower extremity, along with some mild chronic venous stasis.  Minimal venous stasis seen on left lower extremity ?Psychiatry: Appropriate, no evidence of psychoses ?Neurology: No focal deficits ? ?Data Reviewed: ?White blood cell count slightly increased from previous day at 17.3 up from 16.8.  Procalcitonin slightly down at 0.57, down from 0.68. ? ?Disposition:  ?Status is: Inpatient ?Remains inpatient appropriate because: Continued need for IV antibiotics ?  ? ?Anticipated discharge date: 4/7 ? ?Remaining issues to be resolved so that patient can be discharged:  Improvement of right lower extremity cellulitis as well as improvement in white blood cell count ? ? ?Family Communication: Left message for wife ?DVT Prophylaxis: ?  Lovenox ? ? ? ?Author: ?Annita Brod  ,MD ?03/29/2022 5:39 PM ? ?To reach On-call, see care teams to locate the attending and reach out via [email or web address]. ?Between 7PM-7AM, please contact night-coverage ?If you still have difficulty reaching the attending provider, please page the [facility] (Director on Call) for [facility] on amion for assistance. ? ?

## 2022-03-29 NOTE — Plan of Care (Signed)

## 2022-03-29 NOTE — Progress Notes (Signed)
Mobility Specialist - Progress Note ? ? ? 03/29/22 1500  ?Mobility  ?Range of Motion/Exercises Right leg;Left leg  ?Level of Assistance Independent  ?Assistive Device None  ?Distance Ambulated (ft) 0 ft  ?Activity Response Tolerated well  ?$Mobility charge 1 Mobility  ? ? ?Pt supine upon arrival using RA with family at bedside. Pt completes LE Therex with indep. -- increased ROM compared to prior session. Pt left with needs in reach and alarm set. ? ?Brad [name] ?Mobility Specialist ?03/29/22, 3:22 PM ? ? ? ? ?

## 2022-03-30 ENCOUNTER — Inpatient Hospital Stay: Payer: BC Managed Care – PPO

## 2022-03-30 DIAGNOSIS — L0390 Cellulitis, unspecified: Secondary | ICD-10-CM | POA: Diagnosis not present

## 2022-03-30 DIAGNOSIS — N179 Acute kidney failure, unspecified: Secondary | ICD-10-CM | POA: Diagnosis not present

## 2022-03-30 DIAGNOSIS — E876 Hypokalemia: Secondary | ICD-10-CM | POA: Diagnosis not present

## 2022-03-30 DIAGNOSIS — A419 Sepsis, unspecified organism: Secondary | ICD-10-CM | POA: Diagnosis not present

## 2022-03-30 LAB — CBC
HCT: 36.9 % — ABNORMAL LOW (ref 39.0–52.0)
Hemoglobin: 12 g/dL — ABNORMAL LOW (ref 13.0–17.0)
MCH: 26.7 pg (ref 26.0–34.0)
MCHC: 32.5 g/dL (ref 30.0–36.0)
MCV: 82 fL (ref 80.0–100.0)
Platelets: 468 10*3/uL — ABNORMAL HIGH (ref 150–400)
RBC: 4.5 MIL/uL (ref 4.22–5.81)
RDW: 14.3 % (ref 11.5–15.5)
WBC: 19.5 10*3/uL — ABNORMAL HIGH (ref 4.0–10.5)
nRBC: 0 % (ref 0.0–0.2)

## 2022-03-30 LAB — PROCALCITONIN: Procalcitonin: 0.28 ng/mL

## 2022-03-30 MED ORDER — TRAMADOL HCL 50 MG PO TABS
50.0000 mg | ORAL_TABLET | Freq: Four times a day (QID) | ORAL | Status: DC | PRN
  Administered 2022-03-30: 50 mg via ORAL
  Filled 2022-03-30: qty 1

## 2022-03-30 MED ORDER — SODIUM CHLORIDE 0.9 % IV SOLN: INTRAVENOUS | Status: DC | PRN

## 2022-03-30 NOTE — Progress Notes (Signed)
ID ?Pt doing better ?Still has pain rt leg on weight bearing ?Some cough ?No diarrhea ? ?O/e awake and alert, no distress ?Patient Vitals for the past 24 hrs: ? BP Temp Temp src Pulse Resp SpO2  ?03/30/22 1511 131/78 98 ?F (36.7 ?C) -- 90 18 98 %  ?03/30/22 0727 (!) 143/73 98.3 ?F (36.8 ?C) -- 88 18 93 %  ?03/30/22 0359 (!) 142/72 98.8 ?F (37.1 ?C) -- 92 18 94 %  ?03/29/22 1955 132/69 99.3 ?F (37.4 ?C) Oral 96 20 93 %  ? Chest b/l air entry ?Decreased air entry bases ?Hss1s2 ?Abd soft ?CNS non focal ?Rt leg- edema and erythema much improved over shin- on the calf right below knee there is some induration with tenderness ? ?Labs ? ?  Latest Ref Rng & Units 03/30/2022  ?  6:47 AM 03/29/2022  ?  6:34 AM 03/28/2022  ?  9:01 AM  ?CBC  ?WBC 4.0 - 10.5 K/uL 19.5   17.3   16.8    ?Hemoglobin 13.0 - 17.0 g/dL 12.0   11.6   11.8    ?Hematocrit 39.0 - 52.0 % 36.9   35.2   35.9    ?Platelets 150 - 400 K/uL 468   374   293    ?  ? ?  Latest Ref Rng & Units 03/27/2022  ?  6:22 AM 03/26/2022  ?  9:11 AM 03/25/2022  ?  5:10 AM  ?CMP  ?Glucose 70 - 99 mg/dL   114    ?BUN 6 - 20 mg/dL   18    ?Creatinine 0.61 - 1.24 mg/dL   0.87    ?Sodium 135 - 145 mmol/L  134   131    ?Potassium 3.5 - 5.1 mmol/L 3.4   3.1   2.9    ?Chloride 98 - 111 mmol/L   95    ?CO2 22 - 32 mmol/L   26    ?Calcium 8.9 - 10.3 mg/dL   7.8    ?  ? ? ? ?Impression/recommendation ?Severe rt leg cellulitis much improved inspite of worsening leucocytosis- there is an area of induration on the calf for which I recommend US soft tissue to look for any collection- if no abscess but just edema then will DC cefazolin and switch to Augmentin tomorrow ? ?Disucssed the management with patient and hospitalist ?ID will follow him remotely tomorrow- call if needed ? ?

## 2022-03-30 NOTE — Progress Notes (Signed)
Mobility Specialist - Progress Note ? ? 03/30/22 1400  ?Mobility  ?Activity Transferred from chair to bed;Stood at bedside;Dangled on edge of bed  ?Level of Assistance Standby assist, set-up cues, supervision of patient - no hands on  ?Assistive Device Front wheel walker  ?Distance Ambulated (ft) 5 ft  ?Activity Response Tolerated well  ?$Mobility charge 1 Mobility  ? ? ? ?Pt in chair upon arrival using RA. Requests C-B transfer. Pt completes STS with MinA and ambulates 5 steps to sit EOB and descend with supervision. Completes bed mobility ModI and is left with needs in reach. ? ?[name] ?Mobility Specialist ?03/30/22, 2:26 PM ? ? ? ?

## 2022-03-30 NOTE — Progress Notes (Signed)
[facility] Progress Note ? ?Patient: Brad [name]    UDT:[id number]  DOA: 03/24/2022    ?Date of Service: the patient was seen and examined on 03/30/2022 ? ?Brief hospital course: ?46 year old male with past medical history of morbid obesity and hypertension admitted on 4/1 for lower extremity cellulitis requiring IV antibiotics.  Infectious disease consulted.  Despite IV antibiotics, cellulitis slow to improve.  White blood cell count increasing although procalcitonin continues to trend downward. ? ?Assessment and Plan: ?Assessment and Plan: ?* Sepsis due to cellulitis [facility]) ?As evidenced by fever with a Tmax of 101.3, tachycardia, leukocytosis, cellulitis and lactic acidosis.  Status post IV fluids and asked.  Sepsis now resolved.  Initially on IV Rocephin and changed over to cefepime and vancomycin 4/3 when look to be worsening and then Ancef and clindamycin for suspected Streptococcus.  Erythema starting to improve.  Procalcitonin level improving as well although white count continues to trend upward.  ABIs unremarkable.  Have started ambulation and will recheck CBC in the morning.  Potential discharge tomorrow. ? ?Essential hypertension ?Hold hydrochlorothiazide due to dehydration ?We will place patient on amlodipine for blood pressure control ? ?Morbid obesity with BMI of 50.0-59.9, adult ([place]) ?Complicates overall prognosis and care ?Lifestyle modification and exercise has been discussed with patient in detail ? ?AKI (acute kidney injury) ([place]) ?AKI resolved with IV fluids, secondary to diuretic use and poor oral intake ?Patient has a baseline serum creatinine of 0.88 and on admission it is 1.38.  Creatinine by 4/2 back to normal. ? ?Hypokalemia ?Secondary to hydrochlorothiazide use and then following normal saline.  Replacing potassium as needed. ? ? ? ? ? ? ?Body mass index is 50.18 kg/m?.  ?  ?   ? ?Consultants: ?Infectious disease ? ?Procedures: ?None ? ?Antimicrobials: ?IV Rocephin  4/1-4/2 ?IV cefepime and vancomycin 4/2-4/3 ?IV clindamycin 4/3 - 4/5  ?IV Ancef 4/3-present ? ?Code Status: Full code ? ? ?Subjective: Patient doing okay.  No leg pain ? ?Objective: ?Vital signs were reviewed and unremarkable. ?Vitals:  ? 03/30/22 0359 03/30/22 0727  ?BP: (!) 142/72 (!) 143/73  ?Pulse: 92 88  ?Resp: 18 18  ?Temp: 98.8 ?F (37.1 ?C) 98.3 ?F (36.8 ?C)  ?SpO2: 94% 93%  ? ? ?Intake/Output Summary (Last 24 hours) at 03/30/2022 1142 ?Last data filed at 03/30/2022 1037 ?Gross per 24 hour  ?Intake 480 ml  ?Output 1800 ml  ?Net -1320 ml  ? ? ?Filed Weights  ? 03/24/22 0102  ?Weight: (!) 149.7 kg  ? ?Body mass index is 50.18 kg/m?. ? ?Exam: ? ?General: Alert and oriented x3, no acute distress ?HEENT: Normocephalic, atraumatic, mucous membranes are moist ?Cardiovascular: Regular rate and rhythm, S1-S2 ?Respiratory: Clear to auscultation bilaterally ?Abdomen: Soft, obese, nontender, positive bowel sounds ?Musculoskeletal: No clubbing or cyanosis, trace pitting edema, right greater than left.  Leg wrapped. ?Skin: Cellulitis on the anterior aspect of right lower extremity while still there, notes improvement from past few days.  Border is now much more distal from knee ?Psychiatry: Appropriate, no evidence of psychoses ?Neurology: No focal deficits ? ?Data Reviewed: ?White blood cell count up to 19.5 (17.3 yesterday) although procalcitonin down to 0.28 (0.57 yesterday) ? ?Disposition:  ?Status is: Inpatient ?Remains inpatient appropriate because: Continued need for IV antibiotics ?  ? ?Anticipated discharge date: 4/8 ? ?Remaining issues to be resolved so that patient can be discharged: Improvement of right lower extremity cellulitis as well as improvement in white blood cell count ? ? ?Family Communication: Left  message for wife ?DVT Prophylaxis: ?  Lovenox ? ? ? ?Author: ?Annita Brod ,MD ?03/30/2022 11:42 AM ? ?To reach On-call, see care teams to locate the attending and reach out via [email or web address]. ?Between  7PM-7AM, please contact night-coverage ?If you still have difficulty reaching the attending provider, please page the [facility] (Director on Call) for [facility] on amion for assistance. ? ?

## 2022-03-30 NOTE — Progress Notes (Signed)
Mobility Specialist - Progress Note ? ? 03/30/22 1200  ?Mobility  ?Activity Ambulated with assistance in hallway;Dangled on edge of bed;Transferred from chair to bed  ?Level of Assistance Contact guard assist, steadying assist  ?Assistive Device Front wheel walker  ?Distance Ambulated (ft) 50 ft  ?Activity Response Tolerated well  ?$Mobility charge 1 Mobility  ? ? ?Pre-mobility: 101 HR, 96% SpO2 ?During mobility: 113 HR,  96% SpO2 ?Post-mobility: HR, BP, SPO2 ? ?Pt supine upon arrival using RA. Pt completes bed mobility ModI + extra time and STS MinA --- voices R Leg pain and weakness. Stood at bedside ~ 1 minute before ambulating 21f to nursing station and back with CGA d/t trouble weight bearing on R Leg. Voiced 7/10 leg pain upon return. Pt returned to chair with alarm set, needs in reach and RN notified. ? ?MMerrily Brittle?Mobility Specialist ?03/30/22, 12:52 PM ? ? ?

## 2022-03-31 DIAGNOSIS — I10 Essential (primary) hypertension: Secondary | ICD-10-CM | POA: Diagnosis not present

## 2022-03-31 DIAGNOSIS — N179 Acute kidney failure, unspecified: Secondary | ICD-10-CM | POA: Diagnosis not present

## 2022-03-31 DIAGNOSIS — A419 Sepsis, unspecified organism: Secondary | ICD-10-CM | POA: Diagnosis not present

## 2022-03-31 DIAGNOSIS — L0390 Cellulitis, unspecified: Secondary | ICD-10-CM | POA: Diagnosis not present

## 2022-03-31 LAB — BASIC METABOLIC PANEL
Anion gap: 9 (ref 5–15)
BUN: 11 mg/dL (ref 6–20)
CO2: 27 mmol/L (ref 22–32)
Calcium: 8.1 mg/dL — ABNORMAL LOW (ref 8.9–10.3)
Chloride: 95 mmol/L — ABNORMAL LOW (ref 98–111)
Creatinine, Ser: 0.64 mg/dL (ref 0.61–1.24)
GFR, Estimated: >60 mL/min (ref >60)
Glucose, Bld: 117 mg/dL — ABNORMAL HIGH (ref 70–99)
Potassium: 3.9 mmol/L (ref 3.5–5.1)
Sodium: 131 mmol/L — ABNORMAL LOW (ref 135–145)

## 2022-03-31 LAB — CULTURE, BLOOD (ROUTINE X 2)
Culture: NO GROWTH
Special Requests: ADEQUATE

## 2022-03-31 LAB — CBC WITH DIFFERENTIAL/PLATELET
Abs Immature Granulocytes: 1.07 10*3/uL — ABNORMAL HIGH (ref 0.00–0.07)
Basophils Absolute: 0.2 10*3/uL — ABNORMAL HIGH (ref 0.0–0.1)
Basophils Relative: 1 %
Eosinophils Absolute: 0.4 10*3/uL (ref 0.0–0.5)
Eosinophils Relative: 2 %
HCT: 35.9 % — ABNORMAL LOW (ref 39.0–52.0)
Hemoglobin: 12 g/dL — ABNORMAL LOW (ref 13.0–17.0)
Immature Granulocytes: 5 %
Lymphocytes Relative: 10 %
Lymphs Abs: 2.2 10*3/uL (ref 0.7–4.0)
MCH: 27.1 pg (ref 26.0–34.0)
MCHC: 33.4 g/dL (ref 30.0–36.0)
MCV: 81 fL (ref 80.0–100.0)
Monocytes Absolute: 2.1 10*3/uL — ABNORMAL HIGH (ref 0.1–1.0)
Monocytes Relative: 10 %
Neutro Abs: 16 10*3/uL — ABNORMAL HIGH (ref 1.7–7.7)
Neutrophils Relative %: 72 %
Platelets: 535 10*3/uL — ABNORMAL HIGH (ref 150–400)
RBC: 4.43 MIL/uL (ref 4.22–5.81)
RDW: 14 % (ref 11.5–15.5)
WBC: 22 10*3/uL — ABNORMAL HIGH (ref 4.0–10.5)
nRBC: 0 % (ref 0.0–0.2)

## 2022-03-31 LAB — PROCALCITONIN: Procalcitonin: 0.26 ng/mL

## 2022-03-31 MED ORDER — AMOXICILLIN-POT CLAVULANATE 875-125 MG PO TABS: 1.0000 | ORAL_TABLET | Freq: Two times a day (BID) | ORAL | 0 refills | Status: AC

## 2022-03-31 MED ORDER — TRAMADOL HCL 50 MG PO TABS: 50.0000 mg | ORAL_TABLET | Freq: Four times a day (QID) | ORAL | 0 refills | Status: DC | PRN

## 2022-03-31 NOTE — Discharge Summary (Signed)
?Physician Discharge Summary ?  ?Patient: Brad [name] MRN: [id number] DOB: [birth year]  ?Admit date:     03/24/2022  ?Discharge date: 03/31/22  ?Discharge Physician: Annita Brod  ? ?PCP: Olin Hauser, DO  ? ?Recommendations at discharge:  ? ?New medication: Ultram 50 mg p.o. every 6 hours as needed for pain ?New medication: Augmentin 875 p.o. twice daily x5 days ?Patient will follow-up with his PCP in the next 1 to 2 weeks and at that time have a repeat CBC ? ?Discharge Diagnoses: ?Principal Problem: ?  Sepsis due to cellulitis [facility]) ?Active Problems: ?  Essential hypertension ?  Morbid obesity with BMI of 50.0-59.9, adult ([place]) ?  Hypokalemia ?  AKI (acute kidney injury) ([place]) ? ?Resolved Problems: ?  * No resolved hospital problems. * ? ?Hospital Course: ?46 year old male with past medical history of morbid obesity and hypertension admitted on 4/1 for lower extremity cellulitis requiring IV antibiotics.  Infectious disease consulted.  Patient received IV antibiotics for 1 week.  Cellulitis slow to improve but over time did so.  By 4/8, cellulitis clinically appears better and procalcitonin level close to normal. ? ?Assessment and Plan: ?* Sepsis due to cellulitis [facility]) ?As evidenced by fever with a Tmax of 101.3, tachycardia, leukocytosis, cellulitis and lactic acidosis.  Status post IV fluids and asked.  Sepsis now resolved.  Infectious disease consulted.  Initially on IV Rocephin and changed over to cefepime and vancomycin 4/3 when look to be worsening and then Ancef and clindamycin for suspected Streptococcus.  Erythema began to improve.  Procalcitonin level continue to slowly normalize.  ? ?During hospital course however, patient's white blood cell count continued to rise.  Lower extremity Doppler negative for DVT.  ABIs unremarkable.  Infectious disease recommended checking soft tissue ultrasound of right lower extremity given induration and patient found to have some dependent edema  with likely some inflammatory fluid, however no signs of any abscess or fluid to tap.  By day of discharge, patient's white blood cell count up to 22, however vital signs stable procalcitonin level normalizing and patient afebrile.  Infectious disease felt white blood cell count likely reactive.  Patient be discharged on 5 more days of Augmentin and will follow-up with his PCP in the next 1 to 2 weeks for a white blood cell count check. ? ?Essential hypertension ?Hold hydrochlorothiazide due to dehydration. ?Patient on amlodipine during hospitalization for blood pressure control.  Resume HCTZ upon discharge. ? ?Morbid obesity with BMI of 50.0-59.9, adult ([place]) ?Complicates overall prognosis and care ?Lifestyle modification and exercise has been discussed with patient in detail ? ?AKI (acute kidney injury) ([place]) ?AKI resolved with IV fluids, secondary to diuretic use and poor oral intake ?Patient has a baseline serum creatinine of 0.88 and on admission it is 1.38.  Creatinine by 4/2 back to normal. ? ?Hypokalemia ?Secondary to hydrochlorothiazide use and then following normal saline.  Replacing potassium as needed. ? ? ? ? ?  ? ?Pain control - [employer] Controlled Substance Reporting System database was reviewed. and patient was instructed, not to drive, operate heavy machinery, perform activities at heights, swimming or participation in water activities or provide baby-sitting services while on Pain, Sleep and Anxiety Medications; until their outpatient Physician has advised to do so again. Also recommended to not to take more than prescribed Pain, Sleep and Anxiety Medications.  ?Consultants: Infectious disease ?Procedures performed: Lower extremity Dopplers: Negative for DVT ?Lower extremity soft tissue ultrasound: Noting some edema, not enough fluid  for aspiration.  No signs of abscess ?Disposition: Home ?Diet recommendation:  ?Discharge Diet Orders (From admission, onward)  ? ?  Start     Ordered  ?  03/31/22 0000  Diet - low sodium heart healthy       ? 03/31/22 1321  ? ?  ?  ? ?  ? ?Cardiac diet ?DISCHARGE MEDICATION: ?Allergies as of 03/31/2022   ?No Known Allergies ?  ? ?  ?Medication List  ?  ? ?TAKE these medications   ? ?amLODipine 5 MG tablet ?Commonly known as: NORVASC ?Take 5 mg by mouth daily. ?  ?amoxicillin-clavulanate 875-125 MG tablet ?Commonly known as: Augmentin ?Take 1 tablet by mouth every 12 (twelve) hours for 5 days. ?  ?amphetamine-dextroamphetamine 10 MG tablet ?Commonly known as: ADDERALL ?Take 5-10 mg by mouth daily. ?  ?amphetamine-dextroamphetamine 25 MG 24 hr capsule ?Commonly known as: ADDERALL XR ?Take 25 mg by mouth every morning. ?  ?Buprenorphine HCl-Naloxone HCl 8-2 MG Film ?Place 1 strip under the tongue 2 (two) times daily. ?  ?furosemide 20 MG tablet ?Commonly known as: LASIX ?Take 1 tablet (20 mg total) by mouth daily as needed for fluid or edema. ?  ?hydrochlorothiazide 25 MG tablet ?Commonly known as: HYDRODIURIL ?Take 1 tablet (25 mg total) by mouth daily. ?  ?traMADol 50 MG tablet ?Commonly known as: ULTRAM ?Take 1 tablet (50 mg total) by mouth every 6 (six) hours as needed for severe pain. ?  ? ?  ? ? Follow-up Information   ? ? Olin Hauser, DO. Schedule an appointment as soon as possible for a visit.   ?Specialty: Family Medicine ?Why: Call office to be seen within the next 1 week.  Let them know that you were just in the hospital and this is a hospital follow-up visit ?Contact information: ?[address] [place] [address] ?[phone] ? ? ?  ?  ? ?  ?  ? ?  ? ?Discharge Exam: ?Danley Danker Weights  ? 03/24/22 0102  ?Weight: (!) 149.7 kg  ? ?General: Alert and oriented x3 ?Cardiovascular: Regular rate and rhythm, S1-S2 ?Right lower extremity: Swollen 1-2+ pitting edema with erythema on the anterior aspect of his right leg much improved and now about mid leg down to the foot where previously was starting right below the knee.  Leg is edematous with some induration  in the posterior aspect. ? ?Condition at discharge: improving ? ?The results of significant diagnostics from this hospitalization (including imaging, microbiology, ancillary and laboratory) are listed below for reference.  ? ?Imaging Studies: ?DG Chest 2 View ? ?Result Date: 03/24/2022 ?CLINICAL DATA:  Shortness of breath EXAM: CHEST - 2 VIEW COMPARISON:  None FINDINGS: Heart and mediastinal contours are within normal limits. No focal opacities or effusions. No acute bony abnormality. IMPRESSION: No active cardiopulmonary disease. Electronically Signed   By: Rolm Baptise M.D.   On: 03/24/2022 02:00  ? ?US Venous Img Lower Unilateral Left (DVT) ? ?Result Date: 03/25/2022 ?CLINICAL DATA:  Edema x2 days, pain EXAM: Choose 2 LOWER EXTREMITY VENOUS DOPPLER ULTRASOUND TECHNIQUE: Gray-scale sonography with compression, as well as color and duplex ultrasound, were performed to evaluate the deep venous system(s) from the level of the common femoral vein through the popliteal and proximal calf veins. COMPARISON:  Contralateral, from previous day FINDINGS: VENOUS Normal compressibility of the common femoral, superficial femoral, and popliteal veins, as well as the visualized calf veins. Visualized portions of profunda femoral vein and great saphenous vein unremarkable. No filling  defects to suggest DVT on grayscale or color Doppler imaging. Doppler waveforms show normal direction of venous flow, normal respiratory plasticity and response to augmentation. Limited views of the contralateral common femoral vein are unremarkable. OTHER None. Limitations: none IMPRESSION: Negative. Electronically Signed   By: Lucrezia Europe M.D.   On: 03/25/2022 16:30  ? ?US Venous Img Lower Unilateral Right (DVT) ? ?Result Date: 03/24/2022 ?CLINICAL DATA:  Pain and edema.  Color changes. EXAM: RIGHT LOWER EXTREMITY VENOUS DOPPLER ULTRASOUND TECHNIQUE: Gray-scale sonography with compression, as well as color and duplex ultrasound, were performed to evaluate  the deep venous system(s) from the level of the common femoral vein through the popliteal and proximal calf veins. COMPARISON:  None. FINDINGS: VENOUS Normal compressibility of the common femoral, superficial

## 2022-03-31 NOTE — Progress Notes (Signed)
Christella Hartigan III to be D/C'd Home per MD order.  Discussed prescriptions and follow up appointments with the patient. Prescriptions given to patient, medication list explained in detail. Pt verbalized understanding. ? ?Allergies as of 03/31/2022   ?No Known Allergies ?  ? ?  ?Medication List  ?  ? ?TAKE these medications   ? ?amLODipine 5 MG tablet ?Commonly known as: NORVASC ?Take 5 mg by mouth daily. ?  ?amoxicillin-clavulanate 875-125 MG tablet ?Commonly known as: Augmentin ?Take 1 tablet by mouth every 12 (twelve) hours for 5 days. ?  ?amphetamine-dextroamphetamine 10 MG tablet ?Commonly known as: ADDERALL ?Take 5-10 mg by mouth daily. ?  ?amphetamine-dextroamphetamine 25 MG 24 hr capsule ?Commonly known as: ADDERALL XR ?Take 25 mg by mouth every morning. ?  ?Buprenorphine HCl-Naloxone HCl 8-2 MG Film ?Place 1 strip under the tongue 2 (two) times daily. ?  ?furosemide 20 MG tablet ?Commonly known as: LASIX ?Take 1 tablet (20 mg total) by mouth daily as needed for fluid or edema. ?  ?hydrochlorothiazide 25 MG tablet ?Commonly known as: HYDRODIURIL ?Take 1 tablet (25 mg total) by mouth daily. ?  ?traMADol 50 MG tablet ?Commonly known as: ULTRAM ?Take 1 tablet (50 mg total) by mouth every 6 (six) hours as needed for severe pain. ?  ? ?  ? ? ?Vitals:  ? 03/31/22 0353 03/31/22 0732  ?BP: 123/65 127/62  ?Pulse: 94 88  ?Resp: 20 18  ?Temp: 99 ?F (37.2 ?C) 97.8 ?F (36.6 ?C)  ?SpO2: 92% 93%  ? ? ?Skin clean, dry and intact without evidence of skin break down, no evidence of skin tears noted. IV catheter discontinued intact. Site without signs and symptoms of complications. Dressing and pressure applied. Pt denies pain at this time. No complaints noted. ? ?An After Visit Summary was printed and given to the patient. ?Patient escorted via [place], and D/C home via private auto. ? ?[place]  ?

## 2022-03-31 NOTE — Plan of Care (Addendum)
Problem: Education: ?Goal: Knowledge of General Education information will improve ?Description: Including pain rating scale, medication(s)/side effects and non-pharmacologic comfort measures ?Outcome: Not Progressing ?  ?Problem: Clinical Measurements: ?Goal: Ability to maintain clinical measurements within normal limits will improve ?Outcome: Progressing ?Goal: Diagnostic test results will improve ?Outcome: Progressing ?  ?Problem: Safety: ?Goal: Ability to remain free from injury will improve ?Outcome: Progressing ?  ?Problem: Skin Integrity: ?Goal: Risk for impaired skin integrity will decrease ?Outcome: Progressing ?  ?Problem: Clinical Measurements: ?Goal: Ability to avoid or minimize complications of infection will improve ?Outcome: Progressing ?  ?Problem: Skin Integrity: ?Goal: Skin integrity will improve ?Outcome: Progressing ?  ?  ?

## 2022-03-31 NOTE — Plan of Care (Signed)
?  Problem: Education: ?Goal: Knowledge of General Education information will improve ?Description: Including pain rating scale, medication(s)/side effects and non-pharmacologic comfort measures ?Outcome: Not Progressing ?  ?Problem: Clinical Measurements: ?Goal: Ability to maintain clinical measurements within normal limits will improve ?Outcome: Progressing ?Goal: Diagnostic test results will improve ?Outcome: Progressing ?  ?Problem: Safety: ?Goal: Ability to remain free from injury will improve ?Outcome: Progressing ?  ?Problem: Skin Integrity: ?Goal: Risk for impaired skin integrity will decrease ?Outcome: Progressing ?  ?Problem: Clinical Measurements: ?Goal: Ability to avoid or minimize complications of infection will improve ?Outcome: Progressing ?  ?Problem: Skin Integrity: ?Goal: Skin integrity will improve ?Outcome: Progressing ?  ?

## 2022-03-31 NOTE — Progress Notes (Signed)
Mobility Specialist - Progress Note ? ? 03/31/22 1200  ?Mobility  ?Activity Ambulated with assistance in hallway;Stood at bedside;Dangled on edge of bed  ?Level of Assistance Standby assist, set-up cues, supervision of patient - no hands on  ?Assistive Device Front wheel walker  ?Distance Ambulated (ft) 100 ft  ?Activity Response Tolerated well  ?$Mobility charge 1 Mobility  ? ? ?Pt supine in bed upon arrival using RA. Pt completes bed mobility ModI and STS ModI. Pt ambulates 180f -- voices less pain this date. Mobility is much improved. Pt returns to bed with needs in reach. ? ?MMerrily Brittle?Mobility Specialist ?03/31/22, 12:59 PM; ? ? ?

## 2022-04-06 ENCOUNTER — Encounter: Payer: Self-pay | Admitting: Family Medicine

## 2022-04-06 ENCOUNTER — Ambulatory Visit (INDEPENDENT_AMBULATORY_CARE_PROVIDER_SITE_OTHER): Payer: BC Managed Care – PPO | Admitting: Family Medicine

## 2022-04-06 VITALS — BP 130/88 | HR 111 | Ht 68.0 in | Wt 290.2 lb

## 2022-04-06 DIAGNOSIS — I872 Venous insufficiency (chronic) (peripheral): Secondary | ICD-10-CM | POA: Diagnosis not present

## 2022-04-06 DIAGNOSIS — R600 Localized edema: Secondary | ICD-10-CM | POA: Diagnosis not present

## 2022-04-06 DIAGNOSIS — L03115 Cellulitis of right lower limb: Secondary | ICD-10-CM

## 2022-04-06 MED ORDER — CEPHALEXIN 500 MG PO CAPS: 500.0000 mg | ORAL_CAPSULE | Freq: Three times a day (TID) | ORAL | 0 refills | Status: DC

## 2022-04-06 NOTE — Progress Notes (Addendum)
? ?Subjective:  ? ? Patient ID: Brad [name], male    DOB: 03-12-1976, 47 y.o.   MRN: [id number] ? ?Brad [name] is a 46 y.o. male presenting on 04/06/2022 for Hospitalization Follow-up ? ? ?HPI ? ?HOSPITAL FOLLOW-UP VISIT ? ?Hospital/Location: [facility] ?Date of Admission: 03/24/22 ?Date of Discharge: 03/31/22 ?Transitions of care telephone call: Not completed ? ?Reason for Admission: Right Leg Cellulitis, Sepsis ? ?FOLLOW-UP  ?- Hospital H&P and Discharge Summary have been reviewed ?- Patient presents today about 6 days after recent hospitalization. Brief summary of recent course, patient had symptoms of acute onset Right lower leg pain swelling fever, hospitalized, with sepsis due to cellulitis from RLE, treated with IV fluids antibiotics ID consult, on IV Rocephin, changed to Cefepime and Vanc, switch to Ancef Clinda. Thought Strep. Erythema improved. Labs improved. No DVT seen on Doppler testing. Swelling in leg still, had elevated WBC. Discharged with Augmentin x 5 days PO. 1-2 weeks to re-check WBC count. ? ?Recent change for BP we stopped his Amlodipine '5mg'$ , and switched to HCTZ '25mg'$  daily for swelling. Also has Furosemide '20mg'$  PRN. But he has not taken Furosemide at all. ? ?- Today reports overall has done well after discharge. Symptoms of swelling and pain have improved still swollen and has pain at times. Worse if on his feet. ? ?- New medications on discharge: Augmentin 5 days ?- Changes to current meds on discharge: resumed HCTZ ? ?On Discharge they recommended referral to a Vascular specialist for veins. ? ?Goal to return back to work Mon 4/24 with restrictions is his plan. ? ? ?I have reviewed the discharge medication list, and have reconciled the current and discharge medications today. ? ? ? ?  02/13/2022  ?  3:53 PM 12/26/2021  ? 10:47 AM  ?Depression screen PHQ 2/9  ?Decreased Interest 1 0  ?Down, Depressed, Hopeless 1 0  ?PHQ - 2 Score 2 0  ?Altered sleeping 2   ?Tired, decreased energy 2    ?Change in appetite 2   ?Feeling bad or failure about yourself  1   ?Trouble concentrating 2   ?Moving slowly or fidgety/restless 2   ?PHQ-9 Score 13   ?Difficult doing work/chores Somewhat difficult   ? ? ?Social History  ? ?Tobacco Use  ? Smoking status: Some Days  ?  Packs/day: 1.00  ?  Years: 27.00  ?  Pack years: 27.00  ?  Types: Cigarettes  ? Smokeless tobacco: Never  ?Vaping Use  ? Vaping Use: Never used  ?Substance Use Topics  ? Alcohol use: Yes  ?  Alcohol/week: 1.0 - 2.0 standard drink  ?  Types: 1 - 2 Standard drinks or equivalent per week  ? Drug use: Yes  ?  Types: Marijuana  ? ? ?Review of Systems ?Per HPI unless specifically indicated above ? ?   ?Objective:  ?  ?BP 130/88   Pulse (!) 111   Ht '5\' 8"'$  (1.727 m)   Wt 290 lb 3.2 oz (131.6 kg)   SpO2 96%   BMI 44.12 kg/m?   ?Wt Readings from Last 3 Encounters:  ?04/06/22 290 lb 3.2 oz (131.6 kg)  ?03/24/22 (!) 330 lb (149.7 kg)  ?02/13/22 (!) 329 lb 6.4 oz (149.4 kg)  ?  ?Physical Exam ?Vitals and nursing note reviewed.  ?Constitutional:   ?   General: He is not in acute distress. ?   Appearance: He is well-developed. He is not diaphoretic.  ?   Comments: Well-appearing, comfortable,  cooperative  ?HENT:  ?   Head: Normocephalic and atraumatic.  ?Eyes:  ?   General:     ?   Right eye: No discharge.     ?   Left eye: No discharge.  ?   Conjunctiva/sclera: Conjunctivae normal.  ?Neck:  ?   Thyroid: No thyromegaly.  ?Cardiovascular:  ?   Rate and Rhythm: Normal rate and regular rhythm.  ?   Pulses: Normal pulses.  ?   Heart sounds: Normal heart sounds. No murmur heard. ?Pulmonary:  ?   Effort: Pulmonary effort is normal. No respiratory distress.  ?   Breath sounds: Normal breath sounds. No wheezing or rales.  ?Musculoskeletal:     ?   General: Normal range of motion.  ?   Cervical back: Normal range of motion and neck supple.  ?   Right lower leg: Edema (+3 pitting edema, with some dry flaking skin some residual discoloration not significant erythema)  present.  ?   Left lower leg: Edema present.  ?   Comments: Antalgic gait due to Right lower leg pain swelling. Using cane to ambulate  ?Lymphadenopathy:  ?   Cervical: No cervical adenopathy.  ?Skin: ?   General: Skin is warm and dry.  ?   Findings: No erythema or rash.  ?Neurological:  ?   Mental Status: He is alert and oriented to person, place, and time. Mental status is at baseline.  ?Psychiatric:     ?   Behavior: Behavior normal.  ?   Comments: Well groomed, good eye contact, normal speech and thoughts  ? ? ?CLINICAL DATA:  Induration along the calf of the right leg. ?  ?EXAM: ?ULTRASOUND RIGHT LOWER EXTREMITY LIMITED ?  ?TECHNIQUE: ?Ultrasound examination of the lower extremity soft tissues was ?performed in the area of clinical concern. ?  ?COMPARISON:  None. ?  ?FINDINGS: ?There is notable subcutaneous edema in the region of concern along ?the right posterior calf. No discrete mass lesion is identified. No ?drainable fluid collection observed. ?  ?IMPRESSION: ?1. Infiltrative subcutaneous edema in the region of concern along ?the posterior right calf. ?  ?  ?Electronically Signed ?  By: Van Clines M.D. ?  On: 03/30/2022 16:10 ? ?Results for orders placed or performed during the hospital encounter of 03/24/22  ?Resp Panel by RT-PCR (Flu A&B, Covid) Nasopharyngeal Swab  ? Specimen: Nasopharyngeal Swab; Nasopharyngeal(NP) swabs in vial transport medium  ?Result Value Ref Range  ? SARS Coronavirus 2 by RT PCR NEGATIVE NEGATIVE  ? Influenza A by PCR NEGATIVE NEGATIVE  ? Influenza B by PCR NEGATIVE NEGATIVE  ?Blood Culture (routine x 2)  ? Specimen: BLOOD  ?Result Value Ref Range  ? Specimen Description BLOOD BLOOD RIGHT FOREARM   ? Special Requests    ?  BOTTLES DRAWN AEROBIC AND ANAEROBIC Blood Culture adequate volume  ? Culture    ?  NO GROWTH 5 DAYS ?Performed at [facility], [address], [place], [place] [address] ?  ? Report Status 03/29/2022 FINAL   ?Urine Culture  ? Specimen: In/Out  Cath Urine  ?Result Value Ref Range  ? Specimen Description    ?  IN/OUT CATH URINE ?Performed at [facility], [address], [place], [place] [address] ?  ? Special Requests    ?  NONE ?Performed at [facility] : [facility], [address], [place], [place] [address] ?  ? Culture    ?  NO GROWTH ?Performed at [facility],  [address], [place], [place] [address] ?  ? Report Status 03/25/2022 FINAL   ?Culture, blood (Routine X 2) w Reflex to ID Panel  ? Specimen: BLOOD  ?Result Value Ref Range  ? Specimen Description BLOOD BLOOD RIGHT HAND   ? Special Requests    ?  BOTTLES DRAWN AEROBIC AND ANAEROBIC Blood Culture adequate volume  ? Culture    ?  NO GROWTH 5 DAYS ?Performed at [facility], [address], [place], [place] [address] ?  ? Report Status 03/31/2022 FINAL   ?Lactic acid, plasma  ?Result Value Ref Range  ? Lactic Acid, Venous 3.3 (HH) 0.5 - 1.9 mmol/L  ?Lactic acid, plasma  ?Result Value Ref Range  ? Lactic Acid, Venous 2.7 (HH) 0.5 - 1.9 mmol/L  ?Comprehensive metabolic panel  ?Result Value Ref Range  ? Sodium 137 135 - 145 mmol/L  ? Potassium 3.2 (L) 3.5 - 5.1 mmol/L  ? Chloride 95 (L) 98 - 111 mmol/L  ? CO2 27 22 - 32 mmol/L  ? Glucose, Bld 134 (H) 70 - 99 mg/dL  ? BUN 22 (H) 6 - 20 mg/dL  ? Creatinine, Ser 1.38 (H) 0.61 - 1.24 mg/dL  ? Calcium 8.4 (L) 8.9 - 10.3 mg/dL  ? Total Protein 8.1 6.5 - 8.1 g/dL  ? Albumin 3.3 (L) 3.5 - 5.0 g/dL  ? AST 39 15 - 41 U/L  ? ALT 26 0 - 44 U/L  ? Alkaline Phosphatase 45 38 - 126 U/L  ? Total Bilirubin 0.8 0.3 - 1.2 mg/dL  ? GFR, Estimated >60 >60 mL/min  ? Anion gap 15 5 - 15  ?CBC with Differential  ?Result Value Ref Range  ? WBC 16.6 (H) 4.0 - 10.5 K/uL  ? RBC 5.73 4.22 - 5.81 MIL/uL  ? Hemoglobin 15.2 13.0 - 17.0 g/dL  ? HCT 47.8 39.0 - 52.0 %  ? MCV 83.4 80.0 - 100.0 fL  ? MCH 26.5 26.0 - 34.0 pg  ? MCHC 31.8 30.0 - 36.0 g/dL  ? RDW 14.3 11.5 - 15.5 %  ? Platelets 242 150 - 400 K/uL  ? nRBC 0.0 0.0 - 0.2 %  ? Neutrophils Relative %  90 %  ? Neutro Abs 15.0 (H) 1.7 - 7.7 K/uL  ? Lymphocytes Relative 4 %  ? Lymphs Abs 0.6 (L) 0.7 - 4.0 K/uL  ? Monocytes Relative 5 %  ? Monocytes Absolute 0.8 0.1 - 1.0 K/uL  ? Eosinophils Relative 0 %  ? E

## 2022-04-06 NOTE — Patient Instructions (Addendum)
Thank you for coming to the office today. ? ?Go ahead and start the Fluid pill Furosemide '20mg'$  take one tonight. Then tomorrow depending on how you do, can take 1 in the morning and if needed, a 2nd one in the early afternoon. Max dose is 2 a day for first few days to week. Then once improving, can scale back to just max 1 per day. Then eventually phase off to only take it on some days. ? ?Use RICE therapy: ?- R - Rest / relative rest with activity modification avoid overuse of joint ?- I - Ice packs (make sure you use a towel or sock / something to protect skin) ?- C - Compression with ACE wrap to apply pressure and reduce swelling allowing more support ?- E - Elevation - if significant swelling, lift leg above heart level (toes above your nose) to help reduce swelling, most helpful at night after day of being on your feet ? ?Keep taking Hydrochlorothiazide blood pressure pill. ? ?Referral today. If you don't hear back by end of next week, you can call them to schedule. ? ?[facility] ?[address] [place], [place] [address]  ?Main: [phone]  ? ? ?Please schedule a Follow-up Appointment to: Return in about 5 days (around 04/11/2022) for 5 day Weds 4/19 follow up on return to work, swelling. ? ?If you have any other questions or concerns, please feel free to call the office or send a message through [place]. You may also schedule an earlier appointment if necessary. ? ?Additionally, you may be receiving a survey about your experience at our office within a few days to 1 week by e-mail or mail. We value your feedback. ? ?Nobie Putnam, DO ?[place] ? ? ?

## 2022-04-07 LAB — CBC WITH DIFFERENTIAL/PLATELET
Absolute Monocytes: 1009 cells/uL — ABNORMAL HIGH (ref 200–950)
Basophils Absolute: 93 cells/uL (ref 0–200)
Basophils Relative: 0.9 %
Eosinophils Absolute: 144 cells/uL (ref 15–500)
Eosinophils Relative: 1.4 %
HCT: 42.2 % (ref 38.5–50.0)
Hemoglobin: 13.8 g/dL (ref 13.2–17.1)
Lymphs Abs: 2390 cells/uL (ref 850–3900)
MCH: 27 pg (ref 27.0–33.0)
MCHC: 32.7 g/dL (ref 32.0–36.0)
MCV: 82.6 fL (ref 80.0–100.0)
MPV: 10.7 fL (ref 7.5–12.5)
Monocytes Relative: 9.8 %
Neutro Abs: 6664 cells/uL (ref 1500–7800)
Neutrophils Relative %: 64.7 %
Platelets: 772 10*3/uL — ABNORMAL HIGH (ref 140–400)
RBC: 5.11 10*6/uL (ref 4.20–5.80)
RDW: 12.7 % (ref 11.0–15.0)
Total Lymphocyte: 23.2 %
WBC: 10.3 10*3/uL (ref 3.8–10.8)

## 2022-04-07 LAB — COMPLETE METABOLIC PANEL WITH GFR
AG Ratio: 0.7 (calc) — ABNORMAL LOW (ref 1.0–2.5)
ALT: 22 U/L (ref 9–46)
AST: 24 U/L (ref 10–40)
Albumin: 3.2 g/dL — ABNORMAL LOW (ref 3.6–5.1)
Alkaline phosphatase (APISO): 58 U/L (ref 36–130)
BUN: 14 mg/dL (ref 7–25)
CO2: 28 mmol/L (ref 20–32)
Calcium: 9.4 mg/dL (ref 8.6–10.3)
Chloride: 95 mmol/L — ABNORMAL LOW (ref 98–110)
Creat: 0.76 mg/dL (ref 0.60–1.29)
Globulin: 4.6 g/dL (calc) — ABNORMAL HIGH (ref 1.9–3.7)
Glucose, Bld: 118 mg/dL (ref 65–139)
Potassium: 4.5 mmol/L (ref 3.5–5.3)
Sodium: 134 mmol/L — ABNORMAL LOW (ref 135–146)
Total Bilirubin: 0.3 mg/dL (ref 0.2–1.2)
Total Protein: 7.8 g/dL (ref 6.1–8.1)
eGFR: 113 mL/min/{1.73_m2} (ref >=60)

## 2022-04-09 ENCOUNTER — Telehealth: Payer: Self-pay | Admitting: Family Medicine

## 2022-04-09 ENCOUNTER — Ambulatory Visit: Payer: BC Managed Care – PPO | Admitting: Family Medicine

## 2022-04-09 NOTE — Telephone Encounter (Signed)
Pt is calling to ask Dr. Raliegh Ip if his FMLA has been faxed back. Forms need to be returned today. Please advise [phone] ? ?[employer] ?Fax [phone] ?

## 2022-04-11 ENCOUNTER — Ambulatory Visit (INDEPENDENT_AMBULATORY_CARE_PROVIDER_SITE_OTHER): Payer: BC Managed Care – PPO | Admitting: Family Medicine

## 2022-04-11 ENCOUNTER — Encounter: Payer: Self-pay | Admitting: Family Medicine

## 2022-04-11 VITALS — BP 136/82 | HR 107 | Ht 68.0 in | Wt 289.0 lb

## 2022-04-11 DIAGNOSIS — I872 Venous insufficiency (chronic) (peripheral): Secondary | ICD-10-CM

## 2022-04-11 DIAGNOSIS — R600 Localized edema: Secondary | ICD-10-CM

## 2022-04-11 DIAGNOSIS — F1120 Opioid dependence, uncomplicated: Secondary | ICD-10-CM | POA: Diagnosis not present

## 2022-04-11 DIAGNOSIS — F909 Attention-deficit hyperactivity disorder, unspecified type: Secondary | ICD-10-CM | POA: Diagnosis not present

## 2022-04-11 MED ORDER — FUROSEMIDE 40 MG PO TABS: 40.0000 mg | ORAL_TABLET | Freq: Every day | ORAL | 2 refills | Status: DC | PRN

## 2022-04-11 NOTE — Patient Instructions (Addendum)
Thank you for coming to the office today. ? ?Stop Furosemide (lasix) '20mg'$  ( you were doing double dose per day), instead start the new higher dose Furosemide '40mg'$  daily - once as needed, can skip on some days if preferred. ? ?Referral today. If you don't hear back by tomorrow, feel free to call to schedule. ?  ?[facility] ?[address] [place], [place] [address]  ?Main: [phone]  ? ? ?Please schedule a Follow-up Appointment to: Return if symptoms worsen or fail to improve, for keep apt 5/30. ? ?If you have any other questions or concerns, please feel free to call the office or send a message through [place]. You may also schedule an earlier appointment if necessary. ? ?Additionally, you may be receiving a survey about your experience at our office within a few days to 1 week by e-mail or mail. We value your feedback. ? ?Nobie Putnam, DO ?[place] ?

## 2022-04-11 NOTE — Progress Notes (Signed)
? ?Subjective:  ? ? Patient ID: Brad [name], male    DOB: [birth year], 46 y.o.   MRN: [id number] ? ?Brad [name] is a 46 y.o. male presenting on 04/11/2022 for Follow-up LEG SWELLING and return to work note ? ? ?HPI ? ?Lower Extremity Edema ?Venous Insufficiency ? ?Today is follow-up 5 days later from last visit. We have completed his FMLA request and it was submitted and approved for his recent absence and hospitalization. ? ?Interval update since last visit he was covered for additional cellulitis coverage extension on Keflex for 7 days, he has finished 5 out of the 7. Also advised to increase Furosemide double dose 18m x 2 = 421m With good results, improved urination and fluid output and reduced swelling. Also doing more elevation of leg with improvement. ? ?Now, he is getting around and moving a whole lot better. Able to walk more and ready to return to work on Monday 4/24 as scheduled ? ? ? ?  02/13/2022  ?  3:53 PM 12/26/2021  ? 10:47 AM  ?Depression screen PHQ 2/9  ?Decreased Interest 1 0  ?Down, Depressed, Hopeless 1 0  ?PHQ - 2 Score 2 0  ?Altered sleeping 2   ?Tired, decreased energy 2   ?Change in appetite 2   ?Feeling bad or failure about yourself  1   ?Trouble concentrating 2   ?Moving slowly or fidgety/restless 2   ?PHQ-9 Score 13   ?Difficult doing work/chores Somewhat difficult   ? ? ?Social History  ? ?Tobacco Use  ? Smoking status: Some Days  ?  Packs/day: 1.00  ?  Years: 27.00  ?  Pack years: 27.00  ?  Types: Cigarettes  ? Smokeless tobacco: Never  ?Vaping Use  ? Vaping Use: Never used  ?Substance Use Topics  ? Alcohol use: Yes  ?  Alcohol/week: 1.0 - 2.0 standard drink  ?  Types: 1 - 2 Standard drinks or equivalent per week  ? Drug use: Yes  ?  Types: Marijuana  ? ? ?Review of Systems ?Per HPI unless specifically indicated above ? ?   ?Objective:  ?  ?BP 136/82 (BP Location: Left Arm, Cuff Size: Normal)   Pulse (!) 107   Ht 5' 8"  (1.727 m)   Wt 289 lb (131.1 kg)   SpO2 99%    BMI 43.94 kg/m?   ?Wt Readings from Last 3 Encounters:  ?04/11/22 289 lb (131.1 kg)  ?04/06/22 290 lb 3.2 oz (131.6 kg)  ?03/24/22 (!) 330 lb (149.7 kg)  ?  ?Physical Exam ?Vitals and nursing note reviewed.  ?Constitutional:   ?   General: He is not in acute distress. ?   Appearance: Normal appearance. He is well-developed. He is not diaphoretic.  ?   Comments: Well-appearing, comfortable, cooperative  ?HENT:  ?   Head: Normocephalic and atraumatic.  ?Eyes:  ?   General:     ?   Right eye: No discharge.     ?   Left eye: No discharge.  ?   Conjunctiva/sclera: Conjunctivae normal.  ?Cardiovascular:  ?   Rate and Rhythm: Normal rate.  ?Pulmonary:  ?   Effort: Pulmonary effort is normal.  ?Musculoskeletal:  ?   Right lower leg: Edema (persistent firm edema pitting 3+ with extensive skin flaking, no ulceration or drainage or weeping.) present.  ?   Left lower leg: Edema present.  ?Skin: ?   General: Skin is warm and dry.  ?   Findings: No  erythema or rash.  ?Neurological:  ?   Mental Status: He is alert and oriented to person, place, and time.  ?Psychiatric:     ?   Mood and Affect: Mood normal.     ?   Behavior: Behavior normal.     ?   Thought Content: Thought content normal.  ?   Comments: Well groomed, good eye contact, normal speech and thoughts  ? ? ? ?Results for orders placed or performed in visit on 04/06/22  ?COMPLETE METABOLIC PANEL WITH GFR  ?Result Value Ref Range  ? Glucose, Bld 118 65 - 139 mg/dL  ? BUN 14 7 - 25 mg/dL  ? Creat 0.76 0.60 - 1.29 mg/dL  ? eGFR 113 > OR = 60 mL/min/1.64m  ? BUN/Creatinine Ratio NOT APPLICABLE 6 - 22 (calc)  ? Sodium 134 (L) 135 - 146 mmol/L  ? Potassium 4.5 3.5 - 5.3 mmol/L  ? Chloride 95 (L) 98 - 110 mmol/L  ? CO2 28 20 - 32 mmol/L  ? Calcium 9.4 8.6 - 10.3 mg/dL  ? Total Protein 7.8 6.1 - 8.1 g/dL  ? Albumin 3.2 (L) 3.6 - 5.1 g/dL  ? Globulin 4.6 (H) 1.9 - 3.7 g/dL (calc)  ? AG Ratio 0.7 (L) 1.0 - 2.5 (calc)  ? Total Bilirubin 0.3 0.2 - 1.2 mg/dL  ? Alkaline phosphatase  (APISO) 58 36 - 130 U/L  ? AST 24 10 - 40 U/L  ? ALT 22 9 - 46 U/L  ?CBC with Differential/Platelet  ?Result Value Ref Range  ? WBC 10.3 3.8 - 10.8 Thousand/uL  ? RBC 5.11 4.20 - 5.80 Million/uL  ? Hemoglobin 13.8 13.2 - 17.1 g/dL  ? HCT 42.2 38.5 - 50.0 %  ? MCV 82.6 80.0 - 100.0 fL  ? MCH 27.0 27.0 - 33.0 pg  ? MCHC 32.7 32.0 - 36.0 g/dL  ? RDW 12.7 11.0 - 15.0 %  ? Platelets 772 (H) 140 - 400 Thousand/uL  ? MPV 10.7 7.5 - 12.5 fL  ? Neutro Abs 6,664 1,500 - 7,800 cells/uL  ? Lymphs Abs 2,390 850 - 3,900 cells/uL  ? Absolute Monocytes 1,009 (H) 200 - 950 cells/uL  ? Eosinophils Absolute 144 15 - 500 cells/uL  ? Basophils Absolute 93 0 - 200 cells/uL  ? Neutrophils Relative % 64.7 %  ? Total Lymphocyte 23.2 %  ? Monocytes Relative 9.8 %  ? Eosinophils Relative 1.4 %  ? Basophils Relative 0.9 %  ? ?   ?Assessment & Plan:  ? ?Problem List Items Addressed This Visit   ? ? Venous insufficiency of both lower extremities - Primary  ? Relevant Medications  ? furosemide (LASIX) 40 MG tablet  ? ?Other Visit Diagnoses   ? ? Bilateral lower extremity edema      ? Relevant Medications  ? furosemide (LASIX) 40 MG tablet  ? ?  ?  ?Cellulitis - resolving, finish Keflex course 2 more days. ? ?Stop Furosemide (lasix) 226m( you were doing double dose per day), instead start the new higher dose Furosemide 4043maily - once as needed, can skip on some days if preferred. ? ?Referral today. If you don't hear back by tomorrow, feel free to call to schedule. ?  ?[facility] ?[id number] [place] [address] [id number]: [phone] ?Letter written today given to patient, return to work 4/24 w restrictions for 2 weeks. ? ?Meds ordered this encounter  ?Medications  ? furosemide (LASIX) 40  MG tablet  ?  Sig: Take 1 tablet (40 mg total) by mouth daily as needed for edema or fluid.  ?  Dispense:  30 tablet  ?  Refill:  2  ? ? ?Follow up plan: ?Return if symptoms worsen or fail to improve, for keep apt  5/30. ? ? ?Nobie Putnam, DO ?[facility] ?[facility] ?04/11/2022, 3:34 PM ?

## 2022-05-04 DIAGNOSIS — F1120 Opioid dependence, uncomplicated: Secondary | ICD-10-CM | POA: Diagnosis not present

## 2022-05-04 DIAGNOSIS — Z79899 Other long term (current) drug therapy: Secondary | ICD-10-CM | POA: Diagnosis not present

## 2022-05-04 DIAGNOSIS — F909 Attention-deficit hyperactivity disorder, unspecified type: Secondary | ICD-10-CM | POA: Diagnosis not present

## 2022-05-04 DIAGNOSIS — Z1389 Encounter for screening for other disorder: Secondary | ICD-10-CM | POA: Diagnosis not present

## 2022-05-22 ENCOUNTER — Ambulatory Visit: Payer: BC Managed Care – PPO | Admitting: Family Medicine

## 2022-06-08 DIAGNOSIS — Z1389 Encounter for screening for other disorder: Secondary | ICD-10-CM | POA: Diagnosis not present

## 2022-06-08 DIAGNOSIS — F1120 Opioid dependence, uncomplicated: Secondary | ICD-10-CM | POA: Diagnosis not present

## 2022-06-08 DIAGNOSIS — Z79899 Other long term (current) drug therapy: Secondary | ICD-10-CM | POA: Diagnosis not present

## 2022-06-12 ENCOUNTER — Encounter (INDEPENDENT_AMBULATORY_CARE_PROVIDER_SITE_OTHER): Payer: Self-pay | Admitting: Vascular Surgery

## 2022-06-12 ENCOUNTER — Encounter (INDEPENDENT_AMBULATORY_CARE_PROVIDER_SITE_OTHER): Payer: Self-pay | Admitting: Nurse Practitioner

## 2022-06-12 ENCOUNTER — Encounter (INDEPENDENT_AMBULATORY_CARE_PROVIDER_SITE_OTHER): Payer: Self-pay

## 2022-06-18 ENCOUNTER — Ambulatory Visit: Payer: BC Managed Care – PPO | Admitting: Family Medicine

## 2022-06-25 ENCOUNTER — Encounter (INDEPENDENT_AMBULATORY_CARE_PROVIDER_SITE_OTHER): Payer: Self-pay | Admitting: Vascular Surgery

## 2022-06-25 ENCOUNTER — Encounter (INDEPENDENT_AMBULATORY_CARE_PROVIDER_SITE_OTHER): Payer: Self-pay

## 2022-06-29 DIAGNOSIS — Z79899 Other long term (current) drug therapy: Secondary | ICD-10-CM | POA: Diagnosis not present

## 2022-06-29 DIAGNOSIS — F1120 Opioid dependence, uncomplicated: Secondary | ICD-10-CM | POA: Diagnosis not present

## 2022-06-29 DIAGNOSIS — Z1389 Encounter for screening for other disorder: Secondary | ICD-10-CM | POA: Diagnosis not present

## 2022-07-02 ENCOUNTER — Ambulatory Visit: Payer: BC Managed Care – PPO | Admitting: Family Medicine

## 2022-07-03 ENCOUNTER — Ambulatory Visit (INDEPENDENT_AMBULATORY_CARE_PROVIDER_SITE_OTHER): Payer: BC Managed Care – PPO

## 2022-07-03 ENCOUNTER — Encounter (INDEPENDENT_AMBULATORY_CARE_PROVIDER_SITE_OTHER): Payer: Self-pay | Admitting: Vascular Surgery

## 2022-07-03 ENCOUNTER — Ambulatory Visit (INDEPENDENT_AMBULATORY_CARE_PROVIDER_SITE_OTHER): Payer: BC Managed Care – PPO | Admitting: Vascular Surgery

## 2022-07-03 ENCOUNTER — Other Ambulatory Visit (INDEPENDENT_AMBULATORY_CARE_PROVIDER_SITE_OTHER): Payer: Self-pay | Admitting: Vascular Surgery

## 2022-07-03 VITALS — BP 151/85 | HR 80 | Resp 16 | Wt 308.8 lb

## 2022-07-03 DIAGNOSIS — I10 Essential (primary) hypertension: Secondary | ICD-10-CM

## 2022-07-03 DIAGNOSIS — I872 Venous insufficiency (chronic) (peripheral): Secondary | ICD-10-CM | POA: Diagnosis not present

## 2022-07-03 DIAGNOSIS — Z6841 Body Mass Index (BMI) 40.0 and over, adult: Secondary | ICD-10-CM

## 2022-07-03 DIAGNOSIS — I890 Lymphedema, not elsewhere classified: Secondary | ICD-10-CM

## 2022-07-03 DIAGNOSIS — M7989 Other specified soft tissue disorders: Secondary | ICD-10-CM

## 2022-07-03 DIAGNOSIS — A419 Sepsis, unspecified organism: Secondary | ICD-10-CM

## 2022-07-03 DIAGNOSIS — R600 Localized edema: Secondary | ICD-10-CM

## 2022-07-03 DIAGNOSIS — L0390 Cellulitis, unspecified: Secondary | ICD-10-CM

## 2022-07-03 NOTE — Progress Notes (Addendum)
Patient ID: Brad [name], male   DOB: 05-08-1976, 46 y.o.   MRN: [id number]  Chief Complaint  Patient presents with   New Patient (Initial Visit)    Ref Parks Ranger consult le edema with venous insufficiency     HPI Brad [name] is a 46 y.o. male.  I am asked to see the patient by Dr. Parks Ranger for evaluation of right leg swelling and discoloration with what sounds like significant cellulitis issues in this right leg earlier this year.  He is morbidly obese and has swelling in both legs, but the right leg is the more severely affected of the 2 and the 1 is also more painful.  He denies any current chest [name] or shortness of breath.  No fevers or chills.  He has a lot of scabs on that leg with some weeping and a lot of redness in the skin.  It is somewhat tender to the touch.  His left leg is not painful but does have swelling.  We performed noninvasive studies today which showed no DVT or superficial thrombophlebitis.  There was triphasic arterial signals distally.  There was some deep venous reflux but no superficial reflux was identified in the lower extremities.     Past Medical History:  Diagnosis Date   Basal cell carcinoma 07/25/2022   R lat neck - needs surgery   Chronic venous insufficiency of lower extremity    Hypertension     No past surgical history on file.   Family History  Problem Relation Age of Onset   Leukemia Maternal Grandmother    Stroke Maternal Grandfather   No bleeding or clotting disorders   Social History   Tobacco Use   Smoking status: Some Days    Packs/day: 1.00    Years: 27.00    Total pack years: 27.00    Types: Cigarettes   Smokeless tobacco: Never  Vaping Use   Vaping Use: Never used  Substance Use Topics   Alcohol use: Yes    Alcohol/week: 1.0 - 2.0 standard drink of alcohol    Types: 1 - 2 Standard drinks or equivalent per week   Drug use: Yes    Types: Marijuana     No Known Allergies  Current Outpatient  Medications  Medication Sig Dispense Refill   amphetamine-dextroamphetamine (ADDERALL XR) 25 MG 24 hr capsule Take 25 mg by mouth every morning.     amphetamine-dextroamphetamine (ADDERALL) 10 MG tablet Take 5-10 mg by mouth daily.     Buprenorphine HCl-Naloxone HCl 8-2 MG FILM Place 1 strip under the tongue 2 (two) times daily.     amLODipine (NORVASC) 5 MG tablet Take 1 tablet (5 mg total) by mouth daily. 90 tablet 3   amoxicillin-clavulanate (AUGMENTIN) 875-125 MG tablet Take 1 tablet by mouth 2 (two) times daily.     cefdinir (OMNICEF) 300 MG capsule Take 1 capsule (300 mg total) by mouth 2 (two) times daily. (Patient not taking: Reported on 07/31/2022) 14 capsule 0   furosemide (LASIX) 20 MG tablet Take 1 tablet (20 mg total) by mouth daily as needed for edema or fluid. 90 tablet 3   nicotine (NICODERM CQ - DOSED IN MG/24 HOURS) 21 mg/24hr patch 21 mg every morning.     valsartan (DIOVAN) 80 MG tablet Take 1 tablet (80 mg total) by mouth daily. 90 tablet 3   No current facility-administered medications for this visit.      REVIEW OF SYSTEMS (Negative unless checked)  Constitutional: '[]'$ Weight loss  '[]'$ Fever  '[]'$ Chills Cardiac: '[]'$ Chest [name]   '[]'$ Chest pressure   '[]'$ Palpitations   '[]'$ Shortness of breath when laying flat   '[]'$ Shortness of breath at rest   '[]'$ Shortness of breath with exertion. Vascular:  '[]'$ [name] in legs with walking   '[]'$ [name] in legs at rest   '[]'$ [name] in legs when laying flat   '[]'$ Claudication   '[]'$ [name] in feet when walking  '[]'$ [name] in feet at rest  '[]'$ [name] in feet when laying flat   '[]'$ History of DVT   '[]'$ Phlebitis   '[x]'$ Swelling in legs   '[]'$ Varicose veins   '[]'$ Non-healing ulcers Pulmonary:   '[]'$ Uses home oxygen   '[]'$ Productive cough   '[]'$ Hemoptysis   '[]'$ Wheeze  '[]'$ COPD   '[]'$ Asthma Neurologic:  '[]'$ Dizziness  '[]'$ Blackouts   '[]'$ Seizures   '[]'$ History of stroke   '[]'$ History of TIA  '[]'$ Aphasia   '[]'$ Temporary blindness   '[]'$ Dysphagia   '[]'$ Weakness or numbness in arms   '[]'$ Weakness or numbness in  legs Musculoskeletal:  '[x]'$ Arthritis   '[]'$ Joint swelling   '[]'$ Joint [name]   '[]'$ Low back [name] Hematologic:  '[]'$ Easy bruising  '[]'$ Easy bleeding   '[]'$ Hypercoagulable state   '[]'$ Anemic  '[]'$ Hepatitis Gastrointestinal:  '[]'$ Blood in stool   '[]'$ Vomiting blood  '[]'$ Gastroesophageal reflux/heartburn   '[]'$ Abdominal [name] Genitourinary:  '[]'$ Chronic kidney disease   '[]'$ Difficult urination  '[]'$ Frequent urination  '[]'$ Burning with urination   '[]'$ Hematuria Skin:  '[]'$ Rashes   '[x]'$ Ulcers   '[x]'$ Wounds Psychological:  '[]'$ History of anxiety   '[]'$  History of major depression.    Physical Exam BP (!) 151/85 (BP Location: Right Arm)   Pulse 80   Resp 16   Wt (!) 308 lb 12.8 oz (140.1 kg)   BMI 46.95 kg/m  Gen:  WD/WN, NAD. Morbidly obese Head: [place]/AT, No temporalis wasting.  Ear/Nose/Throat: Hearing grossly intact, nares w/o erythema or drainage, oropharynx w/o Erythema/Exudate Eyes: Conjunctiva clear, sclera non-icteric  Neck: trachea midline.  No JVD.  Pulmonary:  Good air movement, respirations not labored, no use of accessory muscles  Cardiac: RRR, no JVD Vascular:  Vessel Right Left  Radial Palpable Palpable                          DP NP 1+  PT NP NP   Gastrointestinal:. No masses, surgical incisions, or scars. Musculoskeletal: M/S 5/5 throughout.  Extremities without ischemic changes.  No deformity or atrophy.  Marked stasis dermatitis changes present on the right leg with several areas of scab and superficial skin openings.  Left leg has mild to moderate stasis dermatitis but no wounds.  3-4+ right lower extremity edema with 2+ left lower extremity edema. Neurologic: Sensation grossly intact in extremities.  Symmetrical.  Speech is fluent. Motor exam as listed above. Psychiatric: Judgment intact, Mood & affect appropriate for pt's clinical situation. Dermatologic: Several superficial scabs present in the right calf and lower leg.    Radiology No results found.  Labs No results found for this or any previous  visit (from the past 2160 hour(s)).   Assessment/Plan:  Swelling of limb We performed noninvasive studies today which showed no DVT or superficial thrombophlebitis.  There was triphasic arterial signals distally.  There was some deep venous reflux but no superficial reflux was identified in the lower extremities.   The patient appears to have severe lymphedema of the right lower extremity as well as some lymphedema in the left leg.  We are going to get him wrapped in Unna boots today.  This to be  at least stage II lymphedema and may be stage [name] lymphedema.  We will do Unna boots on the right leg and recommend compression sock on the left leg.  We will monitor this weekly for about a month and then reassess.  Ultimately, he needs a lymphedema pump, elevation of his legs, chronic compression socks, and a significant amount of weight loss.  Lymphedema We performed noninvasive studies today which showed no DVT or superficial thrombophlebitis.  There was triphasic arterial signals distally.  There was some deep venous reflux but no superficial reflux was identified in the lower extremities.   The patient appears to have severe lymphedema of the right lower extremity as well as some lymphedema in the left leg.  We are going to get him wrapped in Unna boots today.  This to be at least stage II lymphedema and may be stage [name] lymphedema.  We will do Unna boots on the right leg and recommend compression sock on the left leg.  We will monitor this weekly for about a month and then reassess.  Ultimately, he needs a lymphedema pump, elevation of his legs, chronic compression socks, and a significant amount of weight loss.  Essential hypertension blood pressure control important in reducing the progression of atherosclerotic disease. On appropriate oral medications.   Morbid obesity with BMI of 50.0-59.9, adult (HCC) Weight loss will be significantly helpful in terms of reducing the [name] and swelling in his  legs.  Sepsis due to cellulitis [facility]) Likely had lymphedema prior to his infectious issue but this has dramatically worsened.      Brad [name] 09/18/2022, 4:02 PM   This note was created with Dragon medical transcription system.  Any errors from dictation are unintentional.

## 2022-07-03 NOTE — Assessment & Plan Note (Signed)
We performed noninvasive studies today which showed no DVT or superficial thrombophlebitis.  There was triphasic arterial signals distally.  There was some deep venous reflux but no superficial reflux was identified in the lower extremities.   The patient appears to have severe lymphedema of the right lower extremity as well as some lymphedema in the left leg.  We are going to get him wrapped in Unna boots today.  This to be at least stage II lymphedema and may be stage III lymphedema.  We will do Unna boots on the right leg and recommend compression sock on the left leg.  We will monitor this weekly for about a month and then reassess.  Ultimately, he needs a lymphedema pump, elevation of his legs, chronic compression socks, and a significant amount of weight loss.

## 2022-07-03 NOTE — Assessment & Plan Note (Signed)
blood pressure control important in reducing the progression of atherosclerotic disease. On appropriate oral medications.  

## 2022-07-03 NOTE — Assessment & Plan Note (Signed)
Likely had lymphedema prior to his infectious issue but this has dramatically worsened.

## 2022-07-03 NOTE — Assessment & Plan Note (Signed)
Weight loss will be significantly helpful in terms of reducing the pain and swelling in his legs.

## 2022-07-06 ENCOUNTER — Ambulatory Visit (INDEPENDENT_AMBULATORY_CARE_PROVIDER_SITE_OTHER): Payer: BC Managed Care – PPO | Admitting: Family Medicine

## 2022-07-06 ENCOUNTER — Encounter: Payer: Self-pay | Admitting: Family Medicine

## 2022-07-06 VITALS — BP 142/80 | HR 86 | Ht 68.0 in | Wt 314.2 lb

## 2022-07-06 DIAGNOSIS — I872 Venous insufficiency (chronic) (peripheral): Secondary | ICD-10-CM | POA: Diagnosis not present

## 2022-07-06 DIAGNOSIS — R600 Localized edema: Secondary | ICD-10-CM

## 2022-07-06 DIAGNOSIS — I10 Essential (primary) hypertension: Secondary | ICD-10-CM

## 2022-07-06 MED ORDER — VALSARTAN 80 MG PO TABS: 80.0000 mg | ORAL_TABLET | Freq: Every day | ORAL | 3 refills | Status: DC

## 2022-07-06 MED ORDER — FUROSEMIDE 20 MG PO TABS: 20.0000 mg | ORAL_TABLET | Freq: Every day | ORAL | 3 refills | Status: DC | PRN

## 2022-07-06 MED ORDER — AMLODIPINE BESYLATE 5 MG PO TABS: 5.0000 mg | ORAL_TABLET | Freq: Every day | ORAL | 3 refills | Status: DC

## 2022-07-06 NOTE — Progress Notes (Signed)
Subjective:    Patient ID: Brad [name], male    DOB: February 02, 1976, 46 y.o.   MRN: [id number]  Brad [name] is a 46 y.o. male presenting on 07/06/2022 for Hypertension   HPI  Hypertension Lympheema / Lower Ext Edema / Venous Stasis  Interval update since last visit 0/6301  Systolic BP 601-093 range over past several months He does not have a BP cuff at home  Recent course 03/2022, Amlodipine was stopped and Thiazide started however the change was not made. Still on Amlodipine. 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 Worse swelling in leg if hot temperatures outside, more active, or sleep sitting in recliner or not laying down without elevation.  He is improving with elevation  They did non invasive imaging  R leg persistent swelling, RLE Unna boot wrap, and repeat change of Unna boot regularly for several more weeks.  Likely caused by Fluid leakage from deep veins, with Stage [name] Lymphedema likely, they will order DME lymphedema pump.       02/13/2022    3:53 PM 12/26/2021   10:47 AM  Depression screen PHQ 2/9  Decreased Interest 1 0  Down, Depressed, Hopeless 1 0  PHQ - 2 Score 2 0  Altered sleeping 2   Tired, decreased energy 2   Change in appetite 2   Feeling bad or failure about yourself  1   Trouble concentrating 2   Moving slowly or fidgety/restless 2   PHQ-9 Score 13   Difficult doing work/chores Somewhat difficult     Social History   Tobacco Use   Smoking status: Some Days    Packs/day: 1.00    Years: 27.00    Total pack years: 27.00    Types: Cigarettes   Smokeless tobacco: Never  Vaping Use   Vaping Use: Never used  Substance Use Topics   Alcohol use: Yes    Alcohol/week: 1.0 - 2.0 standard drink of alcohol    Types: 1 - 2 Standard drinks or equivalent per week   Drug use: Yes    Types: Marijuana     Review of Systems Per HPI unless specifically indicated above     Objective:    BP (!) 142/80 (BP Location: Left Arm, Cuff Size: Normal)   Pulse 86   Ht 5' 8"  (1.727 m)   Wt (!) 314 lb 3.2 oz (142.5 kg)   SpO2 94%   BMI 47.77 kg/m   Wt Readings from Last 3 Encounters:  07/06/22 (!) 314 lb 3.2 oz (142.5 kg)  07/03/22 (!) 308 lb 12.8 oz (140.1 kg)  04/11/22 289 lb (131.1 kg)    Physical Exam Vitals and nursing note reviewed.  Constitutional:      General: He is not in acute distress.    Appearance: He is well-developed. He is obese. He is not diaphoretic.     Comments: Well-appearing, comfortable, cooperative  HENT:     Head: Normocephalic and atraumatic.  Eyes:     General:        Right eye: No discharge.        Left eye: No discharge.     Conjunctiva/sclera: Conjunctivae normal.  Neck:     Thyroid: No thyromegaly.  Cardiovascular:     Rate and Rhythm: Normal rate and regular rhythm.     Pulses: Normal pulses.  Heart sounds: Normal heart sounds. No murmur heard. Pulmonary:     Effort: Pulmonary effort is normal. No respiratory distress.     Breath sounds: Normal breath sounds. No wheezing or rales.  Musculoskeletal:        General: Normal range of motion.     Cervical back: Normal range of motion and neck supple.     Right lower leg: Edema (Unna boot wrap in place) present.     Left lower leg: Edema present.  Lymphadenopathy:     Cervical: No cervical adenopathy.  Skin:    General: Skin is warm and dry.     Findings: No erythema or rash.  Neurological:     Mental Status: He is alert and oriented to person, place, and time. Mental status is at baseline.  Psychiatric:        Behavior: Behavior normal.     Comments: Well groomed, good eye contact, normal speech and thoughts       Results for orders placed or performed in visit on 04/06/22  COMPLETE METABOLIC PANEL WITH GFR  Result Value Ref Range   Glucose, Bld 118 65 - 139 mg/dL   BUN 14 7 - 25  mg/dL   Creat 0.76 0.60 - 1.29 mg/dL   eGFR 113 > OR = 60 mL/min/1.85m   BUN/Creatinine Ratio NOT APPLICABLE 6 - 22 (calc)   Sodium 134 (L) 135 - 146 mmol/L   Potassium 4.5 3.5 - 5.3 mmol/L   Chloride 95 (L) 98 - 110 mmol/L   CO2 28 20 - 32 mmol/L   Calcium 9.4 8.6 - 10.3 mg/dL   Total Protein 7.8 6.1 - 8.1 g/dL   Albumin 3.2 (L) 3.6 - 5.1 g/dL   Globulin 4.6 (H) 1.9 - 3.7 g/dL (calc)   AG Ratio 0.7 (L) 1.0 - 2.5 (calc)   Total Bilirubin 0.3 0.2 - 1.2 mg/dL   Alkaline phosphatase (APISO) 58 36 - 130 U/L   AST 24 10 - 40 U/L   ALT 22 9 - 46 U/L  CBC with Differential/Platelet  Result Value Ref Range   WBC 10.3 3.8 - 10.8 Thousand/uL   RBC 5.11 4.20 - 5.80 Million/uL   Hemoglobin 13.8 13.2 - 17.1 g/dL   HCT 42.2 38.5 - 50.0 %   MCV 82.6 80.0 - 100.0 fL   MCH 27.0 27.0 - 33.0 pg   MCHC 32.7 32.0 - 36.0 g/dL   RDW 12.7 11.0 - 15.0 %   Platelets 772 (H) 140 - 400 Thousand/uL   MPV 10.7 7.5 - 12.5 fL   Neutro Abs 6,664 1,500 - 7,800 cells/uL   Lymphs Abs 2,390 850 - 3,900 cells/uL   Absolute Monocytes 1,009 (H) 200 - 950 cells/uL   Eosinophils Absolute 144 15 - 500 cells/uL   Basophils Absolute 93 0 - 200 cells/uL   Neutrophils Relative % 64.7 %   Total Lymphocyte 23.2 %   Monocytes Relative 9.8 %   Eosinophils Relative 1.4 %   Basophils Relative 0.9 %      Assessment & Plan:   Problem List Items Addressed This Visit     Venous insufficiency of both lower extremities   Relevant Medications   amLODipine (NORVASC) 5 MG tablet   valsartan (DIOVAN) 80 MG tablet   furosemide (LASIX) 20 MG tablet   Essential hypertension - Primary   Relevant Medications   amLODipine (NORVASC) 5 MG tablet   valsartan (DIOVAN) 80 MG tablet   furosemide (LASIX) 20 MG tablet  Other Relevant Orders   BASIC METABOLIC PANEL WITH GFR   Other Visit Diagnoses     Bilateral lower extremity edema       Relevant Medications   furosemide (LASIX) 20 MG tablet       Blood pressure Keep on  Amlodipine 71m daily (will not re order Hydrochlorothiazide based on prior lab results with lower Sodium and K)  Start new Valsartan 824mdaily both in morning, new ARB. Reviewed rare risk of angioedema, swelling face tongue lips etc, if you get this reaction, stop taking the pill, seek help if needed  Furosemide fluid pill, will reduce dose from 4011mown to 67m68make only when you need, okay to skip some doses.  Keep up with the vein vascular doctors, and agree with the Unna boots and the Lymphedema pump  Keep elevating legs RICE therapy  Lab to check BMET Cr K after ARB start in 2 weeks   Meds ordered this encounter  Medications   amLODipine (NORVASC) 5 MG tablet    Sig: Take 1 tablet (5 mg total) by mouth daily.    Dispense:  90 tablet    Refill:  3   valsartan (DIOVAN) 80 MG tablet    Sig: Take 1 tablet (80 mg total) by mouth daily.    Dispense:  90 tablet    Refill:  3   furosemide (LASIX) 20 MG tablet    Sig: Take 1 tablet (20 mg total) by mouth daily as needed for edema or fluid.    Dispense:  90 tablet    Refill:  3      Follow up plan: Return in about 4 months (around 11/06/2022) for 2-3 week non fasting lab only then from today - 4 month follow-up HTN Edema.  BMET 2 weeks  [facility]Nobie Putnam [facility] 07/06/2022, 4:19 PM

## 2022-07-06 NOTE — Patient Instructions (Addendum)
Thank you for coming to the office today.  Blood pressure Keep on Amlodipine '5mg'$  daily Start new Valsartan '80mg'$  daily both in morning  Rare risk of angioedema, swelling face tongue lips etc, if you get this reaction, stop taking the pill, seek help if needed  Furosemide fluid pill, will downgrade from '40mg'$  down to '20mg'$ . Take only when you need, okay to skip some doses.  Okay to take multivitamin regularly  Keep up with the vein vascular doctors, and agree with the Unna boots and the Lymphedema pump  Keep elevating  Lab in 2-3 weeks. You can eat / drink before the blood. Stay hydrated.   Please schedule a Follow-up Appointment to: Return in about 4 months (around 11/06/2022) for 2-3 week non fasting lab only then from today - 4 month follow-up HTN Edema.  If you have any other questions or concerns, please feel free to call the office or send a message through [place]. You may also schedule an earlier appointment if necessary.  Additionally, you may be receiving a survey about your experience at our office within a few days to 1 week by e-mail or mail. We value your feedback.  Nobie Putnam, DO [place]

## 2022-07-10 ENCOUNTER — Encounter (INDEPENDENT_AMBULATORY_CARE_PROVIDER_SITE_OTHER): Payer: BC Managed Care – PPO

## 2022-07-11 ENCOUNTER — Encounter (INDEPENDENT_AMBULATORY_CARE_PROVIDER_SITE_OTHER): Payer: Self-pay

## 2022-07-11 ENCOUNTER — Ambulatory Visit (INDEPENDENT_AMBULATORY_CARE_PROVIDER_SITE_OTHER): Payer: BC Managed Care – PPO | Admitting: Nurse Practitioner

## 2022-07-11 VITALS — BP 130/72 | HR 95 | Resp 16 | Wt 307.8 lb

## 2022-07-11 DIAGNOSIS — L03115 Cellulitis of right lower limb: Secondary | ICD-10-CM | POA: Diagnosis not present

## 2022-07-11 MED ORDER — CEFDINIR 300 MG PO CAPS: 300.0000 mg | ORAL_CAPSULE | Freq: Two times a day (BID) | ORAL | 0 refills | Status: DC

## 2022-07-11 NOTE — Progress Notes (Signed)
History of Present Illness  There is no documented history at this time  Assessments & Plan   1. Cellulitis of right lower extremity  - cefdinir (OMNICEF) 300 MG capsule; Take 1 capsule (300 mg total) by mouth 2 (two) times daily.  Dispense: 14 capsule; Refill: 0     Additional instructions  Subjective:  Patient presents with venous ulcer of the Right lower extremity.    Procedure:  3 layer unna wrap was placed Right lower extremity.   Plan:   Follow up in one week.

## 2022-07-15 ENCOUNTER — Other Ambulatory Visit: Payer: Self-pay | Admitting: Family Medicine

## 2022-07-15 DIAGNOSIS — I10 Essential (primary) hypertension: Secondary | ICD-10-CM

## 2022-07-17 ENCOUNTER — Ambulatory Visit (INDEPENDENT_AMBULATORY_CARE_PROVIDER_SITE_OTHER): Payer: BC Managed Care – PPO | Admitting: Nurse Practitioner

## 2022-07-17 ENCOUNTER — Encounter (INDEPENDENT_AMBULATORY_CARE_PROVIDER_SITE_OTHER): Payer: Self-pay | Admitting: Nurse Practitioner

## 2022-07-17 ENCOUNTER — Encounter (INDEPENDENT_AMBULATORY_CARE_PROVIDER_SITE_OTHER): Payer: BC Managed Care – PPO

## 2022-07-17 VITALS — BP 144/85 | HR 106 | Resp 16 | Wt 310.4 lb

## 2022-07-17 DIAGNOSIS — I890 Lymphedema, not elsewhere classified: Secondary | ICD-10-CM | POA: Diagnosis not present

## 2022-07-17 NOTE — Progress Notes (Signed)
History of Present Illness  There is no documented history at this time  Assessments & Plan   There are no diagnoses linked to this encounter.    Additional instructions  Subjective:  Patient presents with venous ulcer of the Bilateral lower extremity.    Procedure:  3 layer unna wrap was placed Bilateral lower extremity.   Plan:   Follow up in one week.  

## 2022-07-17 NOTE — Telephone Encounter (Signed)
Requested medication (s) are due for refill today: no  Requested medication (s) are on the active medication list: yes  Last refill:  07/06/22  Future visit scheduled: yes  Notes to clinic:  Unable to refill per protocol, last refill by provider on 07/06/22 for 90 days and 3 refills. Possible duplicate request.     Requested Prescriptions  Pending Prescriptions Disp Refills   amLODipine (NORVASC) 5 MG tablet [Pharmacy Med Name: AMLODIPINE BESYLATE '5MG'$  TABLETS] 30 tablet     Sig: TAKE 1 TABLET(5 MG) BY MOUTH DAILY     Cardiovascular: Calcium Channel Blockers 2 Passed - 07/15/2022  3:15 AM      Passed - Last BP in normal range    BP Readings from Last 1 Encounters:  07/11/22 130/72         Passed - Last Heart Rate in normal range    Pulse Readings from Last 1 Encounters:  07/11/22 95         Passed - Valid encounter within last 6 months    Recent Outpatient Visits           1 week ago Essential hypertension   [place], DO   3 months ago Venous insufficiency of both lower extremities   [place], DO   3 months ago Cellulitis of right lower extremity   [place], DO   5 months ago Essential hypertension   [facility] Olin Hauser, DO   6 months ago Essential hypertension   [place], DO       Future Appointments             In 1 week Ralene Bathe, MD [place] [facility]   In 3 months [place], [facility], [facility]

## 2022-07-24 ENCOUNTER — Encounter (INDEPENDENT_AMBULATORY_CARE_PROVIDER_SITE_OTHER): Payer: Self-pay

## 2022-07-24 ENCOUNTER — Ambulatory Visit (INDEPENDENT_AMBULATORY_CARE_PROVIDER_SITE_OTHER): Payer: BC Managed Care – PPO | Admitting: Nurse Practitioner

## 2022-07-24 ENCOUNTER — Other Ambulatory Visit: Payer: BC Managed Care – PPO

## 2022-07-24 VITALS — BP 134/81 | HR 87 | Resp 16 | Wt 309.0 lb

## 2022-07-24 DIAGNOSIS — I890 Lymphedema, not elsewhere classified: Secondary | ICD-10-CM

## 2022-07-24 DIAGNOSIS — I10 Essential (primary) hypertension: Secondary | ICD-10-CM

## 2022-07-24 NOTE — Progress Notes (Signed)
History of Present Illness  There is no documented history at this time  Assessments & Plan   There are no diagnoses linked to this encounter.    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    Procedure:  3 layer unna wrap was placed Right lower extremity.   Plan:   Follow up in one week.   

## 2022-07-25 ENCOUNTER — Ambulatory Visit (INDEPENDENT_AMBULATORY_CARE_PROVIDER_SITE_OTHER): Payer: BC Managed Care – PPO | Admitting: Dermatology

## 2022-07-25 DIAGNOSIS — L578 Other skin changes due to chronic exposure to nonionizing radiation: Secondary | ICD-10-CM | POA: Diagnosis not present

## 2022-07-25 DIAGNOSIS — C4441 Basal cell carcinoma of skin of scalp and neck: Secondary | ICD-10-CM | POA: Diagnosis not present

## 2022-07-25 DIAGNOSIS — C4491 Basal cell carcinoma of skin, unspecified: Secondary | ICD-10-CM

## 2022-07-25 DIAGNOSIS — D492 Neoplasm of unspecified behavior of bone, soft tissue, and skin: Secondary | ICD-10-CM

## 2022-07-25 HISTORY — DX: Basal cell carcinoma of skin, unspecified: C44.91

## 2022-07-25 NOTE — Patient Instructions (Addendum)
Wound Care Instructions  Cleanse wound gently with soap and water once a day then pat dry with clean gauze. Apply a thing coat of Petrolatum (petroleum jelly, "Vaseline") over the wound (unless you have an allergy to this). We recommend that you use a new, sterile tube of Vaseline. Do not pick or remove scabs. Do not remove the yellow or white "healing tissue" from the base of the wound.  Cover the wound with fresh, clean, nonstick gauze and secure with paper tape. You may use Band-Aids in place of gauze and tape if the would is small enough, but would recommend trimming much of the tape off as there is often too much. Sometimes Band-Aids can irritate the skin.  You should call the office for your biopsy report after 1 week if you have not already been contacted.  If you experience any problems, such as abnormal amounts of bleeding, swelling, significant bruising, significant pain, or evidence of infection, please call the office immediately.  FOR ADULT SURGERY PATIENTS: If you need something for pain relief you may take 1 extra strength Tylenol (acetaminophen) AND 2 Ibuprofen (200mg each) together every 4 hours as needed for pain. (do not take these if you are allergic to them or if you have a reason you should not take them.) Typically, you may only need pain medication for 1 to 3 days.    Due to recent changes in healthcare laws, you may see results of your pathology and/or laboratory studies on MyChart before the doctors have had a chance to review them. We understand that in some cases there may be results that are confusing or concerning to you. Please understand that not all results are received at the same time and often the doctors may need to interpret multiple results in order to provide you with the best plan of care or course of treatment. Therefore, we ask that you please give us 2 business days to thoroughly review all your results before contacting the office for clarification. Should we  see a critical lab result, you will be contacted sooner.   If You Need Anything After Your Visit  If you have any questions or concerns for your doctor, please call our main line at [phone] and press option 4 to reach your doctor's medical assistant. If no one answers, please leave a voicemail as directed and we will return your call as soon as possible. Messages left after 4 pm will be answered the following business day.   You may also send us a message via MyChart. We typically respond to MyChart messages within 1-2 business days.  For prescription refills, please ask your pharmacy to contact our office. Our fax number is [phone].  If you have an urgent issue when the clinic is closed that cannot wait until the next business day, you can page your doctor at the number below.    Please note that while we do our best to be available for urgent issues outside of office hours, we are not available 24/7.   If you have an urgent issue and are unable to reach us, you may choose to seek medical care at your doctor's office, retail clinic, urgent care center, or emergency room.  If you have a medical emergency, please immediately call 911 or go to the emergency department.  Pager Numbers  - Dr. Kowalski: [phone]  - Dr. Moye: [phone]  - Dr. Stewart: [phone]  In the event of inclement weather, please call our main line at [phone]   for an update on the status of any delays or closures.  Dermatology Medication Tips: Please keep the boxes that topical medications come in in order to help keep track of the instructions about where and how to use these. Pharmacies typically print the medication instructions only on the boxes and not directly on the medication tubes.   If your medication is too expensive, please contact our office at [phone] option 4 or send us a message through MyChart.   We are unable to tell what your co-pay for medications will be in advance  as this is different depending on your insurance coverage. However, we may be able to find a substitute medication at lower cost or fill out paperwork to get insurance to cover a needed medication.   If a prior authorization is required to get your medication covered by your insurance company, please allow us 1-2 business days to complete this process.  Drug prices often vary depending on where the prescription is filled and some pharmacies may offer cheaper prices.  The website www.goodrx.com contains coupons for medications through different pharmacies. The prices here do not account for what the cost may be with help from insurance (it may be cheaper with your insurance), but the website can give you the price if you did not use any insurance.  - You can print the associated coupon and take it with your prescription to the pharmacy.  - You may also stop by our office during regular business hours and pick up a GoodRx coupon card.  - If you need your prescription sent electronically to a different pharmacy, notify our office through [facility] MyChart or by phone at [phone] option 4.     Si Usted Necesita Algo Despus de Su Visita  Tambin puede enviarnos un mensaje a travs de MyChart. Por lo general respondemos a los mensajes de MyChart en el transcurso de 1 a 2 das hbiles.  Para renovar recetas, por favor pida a su farmacia que se ponga en contacto con nuestra oficina. Nuestro nmero de fax es el [phone].  Si tiene un asunto urgente cuando la clnica est cerrada y que no puede esperar hasta el siguiente da hbil, puede llamar/localizar a su doctor(a) al nmero que aparece a continuacin.   Por favor, tenga en cuenta que aunque hacemos todo lo posible para estar disponibles para asuntos urgentes fuera del horario de oficina, no estamos disponibles las 24 horas del da, los 7 das de la semana.   Si tiene un problema urgente y no puede comunicarse con nosotros, puede optar  por buscar atencin mdica  en el consultorio de su doctor(a), en una clnica privada, en un centro de atencin urgente o en una sala de emergencias.  Si tiene una emergencia mdica, por favor llame inmediatamente al 911 o vaya a la sala de emergencias.  Nmeros de bper  - Dr. Kowalski: [phone]  - Dra. Moye: [phone]  - Dra. Stewart: [phone]  En caso de inclemencias del tiempo, por favor llame a nuestra lnea principal al [phone] para una actualizacin sobre el estado de cualquier retraso o cierre.  Consejos para la medicacin en dermatologa: Por favor, guarde las cajas en las que vienen los medicamentos de uso tpico para ayudarle a seguir las instrucciones sobre dnde y cmo usarlos. Las farmacias generalmente imprimen las instrucciones del medicamento slo en las cajas y no directamente en los tubos del medicamento.   Si su medicamento es muy caro, por favor, pngase en contacto con nuestra   oficina llamando al [phone] y presione la opcin 4 o envenos un mensaje a travs de MyChart.   No podemos decirle cul ser su copago por los medicamentos por adelantado ya que esto es diferente dependiendo de la cobertura de su seguro. Sin embargo, es posible que podamos encontrar un medicamento sustituto a menor costo o llenar un formulario para que el seguro cubra el medicamento que se considera necesario.   Si se requiere una autorizacin previa para que su compaa de seguros cubra su medicamento, por favor permtanos de 1 a 2 das hbiles para completar este proceso.  Los precios de los medicamentos varan con frecuencia dependiendo del lugar de dnde se surte la receta y alguna farmacias pueden ofrecer precios ms baratos.  El sitio web www.goodrx.com tiene cupones para medicamentos de diferentes farmacias. Los precios aqu no tienen en cuenta lo que podra costar con la ayuda del seguro (puede ser ms barato con su seguro), pero el sitio web puede darle el precio si  no utiliz ningn seguro.  - Puede imprimir el cupn correspondiente y llevarlo con su receta a la farmacia.  - Tambin puede pasar por nuestra oficina durante el horario de atencin regular y recoger una tarjeta de cupones de GoodRx.  - Si necesita que su receta se enve electrnicamente a una farmacia diferente, informe a nuestra oficina a travs de MyChart de [facility] o por telfono llamando al [phone] y presione la opcin 4.  

## 2022-07-25 NOTE — Progress Notes (Unsigned)
   New Patient Visit  Subjective  Brad [name] is a 46 y.o. male who presents for the following: check spot (R neck, ~1 yr, itchy when he gets hot, no treatment, no hx of skin cancer, no Fhx of skin cancer). The patient has spots, moles and lesions to be evaluated, some may be new or changing and the patient has concerns that these could be cancer.  New patient referral from dr. Nobie Putnam..  The following portions of the chart were reviewed this encounter and updated as appropriate:   Tobacco  Allergies  Meds  Problems  Med Hx  Surg Hx  Fam Hx     Review of Systems:  No other skin or systemic complaints except as noted in HPI or Assessment and Plan.  Objective  Well appearing patient in no apparent distress; mood and affect are within normal limits.  A focused examination was performed including face, neck. Relevant physical exam findings are noted in the Assessment and Plan.  R lat neck 2.1 x 1.0cm pearly pap        Assessment & Plan  Neoplasm of skin R lat neck  Skin / nail biopsy Type of biopsy: tangential   Informed consent: discussed and consent obtained   Timeout: patient name, date of birth, surgical site, and procedure verified   Procedure prep:  Patient was prepped and draped in usual sterile fashion Prep type:  Isopropyl alcohol Anesthesia: the lesion was anesthetized in a standard fashion   Anesthetic:  1% lidocaine w/ epinephrine 1-100,000 buffered w/ 8.4% NaHCO3 Instrument used: flexible razor blade   Outcome: patient tolerated procedure well   Post-procedure details: sterile dressing applied and wound care instructions given   Dressing type: bandage and bacitracin    Specimen 1 - Surgical pathology Differential Diagnosis: D48.5 R/O BCC  Check Margins: No 2.1 x 1.0cm pearly pap  Discussed recommending excision if BCC  Actinic Damage - chronic, secondary to cumulative UV radiation exposure/sun exposure over time - diffuse  scaly erythematous macules with underlying dyspigmentation - Recommend daily broad spectrum sunscreen SPF 30+ to sun-exposed areas, reapply every 2 hours as needed.  - Recommend staying in the shade or wearing long sleeves, sun glasses (UVA+UVB protection) and wide brim hats (4-inch brim around the entire circumference of the hat). - Call for new or changing lesions.  Return for PRN pending bx results.  I, Othelia Pulling, RMA, am acting as scribe for Sarina Ser, MD . Documentation: I have reviewed the above documentation for accuracy and completeness, and I agree with the above.  Sarina Ser, MD

## 2022-07-26 ENCOUNTER — Encounter: Payer: Self-pay | Admitting: Dermatology

## 2022-07-31 ENCOUNTER — Encounter (INDEPENDENT_AMBULATORY_CARE_PROVIDER_SITE_OTHER): Payer: BC Managed Care – PPO

## 2022-07-31 ENCOUNTER — Ambulatory Visit (INDEPENDENT_AMBULATORY_CARE_PROVIDER_SITE_OTHER): Payer: BC Managed Care – PPO | Admitting: Nurse Practitioner

## 2022-07-31 ENCOUNTER — Encounter (INDEPENDENT_AMBULATORY_CARE_PROVIDER_SITE_OTHER): Payer: Self-pay

## 2022-07-31 VITALS — BP 145/82 | HR 88 | Resp 16 | Wt 300.0 lb

## 2022-07-31 DIAGNOSIS — I890 Lymphedema, not elsewhere classified: Secondary | ICD-10-CM

## 2022-07-31 NOTE — Progress Notes (Signed)
History of Present Illness  There is no documented history at this time  Assessments & Plan   There are no diagnoses linked to this encounter.    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    Procedure:  3 layer unna wrap was placed Right lower extremity.   Plan:   Follow up in one week.   Augmentin '875mg'$  BID for 14 days called into pharmacy.

## 2022-08-06 ENCOUNTER — Telehealth: Payer: Self-pay

## 2022-08-06 DIAGNOSIS — F1120 Opioid dependence, uncomplicated: Secondary | ICD-10-CM | POA: Diagnosis not present

## 2022-08-06 NOTE — Telephone Encounter (Signed)
-----   Message from David C Kowalski, MD sent at 07/26/2022  5:13 PM EDT ----- Diagnosis Skin , right lat neck BASAL CELL CARCINOMA, NODULAR PATTERN, BASE INVOLVED  Cancer - BCC As suspected Schedule surgery 

## 2022-08-06 NOTE — Telephone Encounter (Signed)
Left message for patient to call office for results/hd 

## 2022-08-07 ENCOUNTER — Ambulatory Visit (INDEPENDENT_AMBULATORY_CARE_PROVIDER_SITE_OTHER): Payer: BC Managed Care – PPO | Admitting: Nurse Practitioner

## 2022-08-07 ENCOUNTER — Encounter (INDEPENDENT_AMBULATORY_CARE_PROVIDER_SITE_OTHER): Payer: Self-pay | Admitting: Nurse Practitioner

## 2022-08-07 VITALS — BP 143/70 | HR 66 | Resp 16 | Wt 301.8 lb

## 2022-08-07 DIAGNOSIS — I10 Essential (primary) hypertension: Secondary | ICD-10-CM | POA: Diagnosis not present

## 2022-08-07 DIAGNOSIS — L0390 Cellulitis, unspecified: Secondary | ICD-10-CM

## 2022-08-07 DIAGNOSIS — M7989 Other specified soft tissue disorders: Secondary | ICD-10-CM | POA: Diagnosis not present

## 2022-08-07 DIAGNOSIS — A419 Sepsis, unspecified organism: Secondary | ICD-10-CM

## 2022-08-08 ENCOUNTER — Telehealth: Payer: Self-pay

## 2022-08-08 NOTE — Telephone Encounter (Signed)
Left voicemail to return my call

## 2022-08-08 NOTE — Telephone Encounter (Signed)
-----   Message from David C Kowalski, MD sent at 07/26/2022  5:13 PM EDT ----- Diagnosis Skin , right lat neck BASAL CELL CARCINOMA, NODULAR PATTERN, BASE INVOLVED  Cancer - BCC As suspected Schedule surgery 

## 2022-08-13 ENCOUNTER — Telehealth: Payer: Self-pay

## 2022-08-13 NOTE — Telephone Encounter (Signed)
Left message on voicemail to return my call.  

## 2022-08-13 NOTE — Telephone Encounter (Signed)
-----   Message from David C Kowalski, MD sent at 07/26/2022  5:13 PM EDT ----- Diagnosis Skin , right lat neck BASAL CELL CARCINOMA, NODULAR PATTERN, BASE INVOLVED  Cancer - BCC As suspected Schedule surgery 

## 2022-08-13 NOTE — Telephone Encounter (Signed)
Opened in error

## 2022-08-19 IMAGING — CR DG CHEST 2V
1 series · 2 of 2 positions shown · non-contrast
Comparison: None

CLINICAL DATA: Shortness of breath

EXAM:
CHEST - 2 VIEW

[Series 1: dg chest 2 view · 0.14mm/px · 2 of 2 slices shown]
[im 1/2]
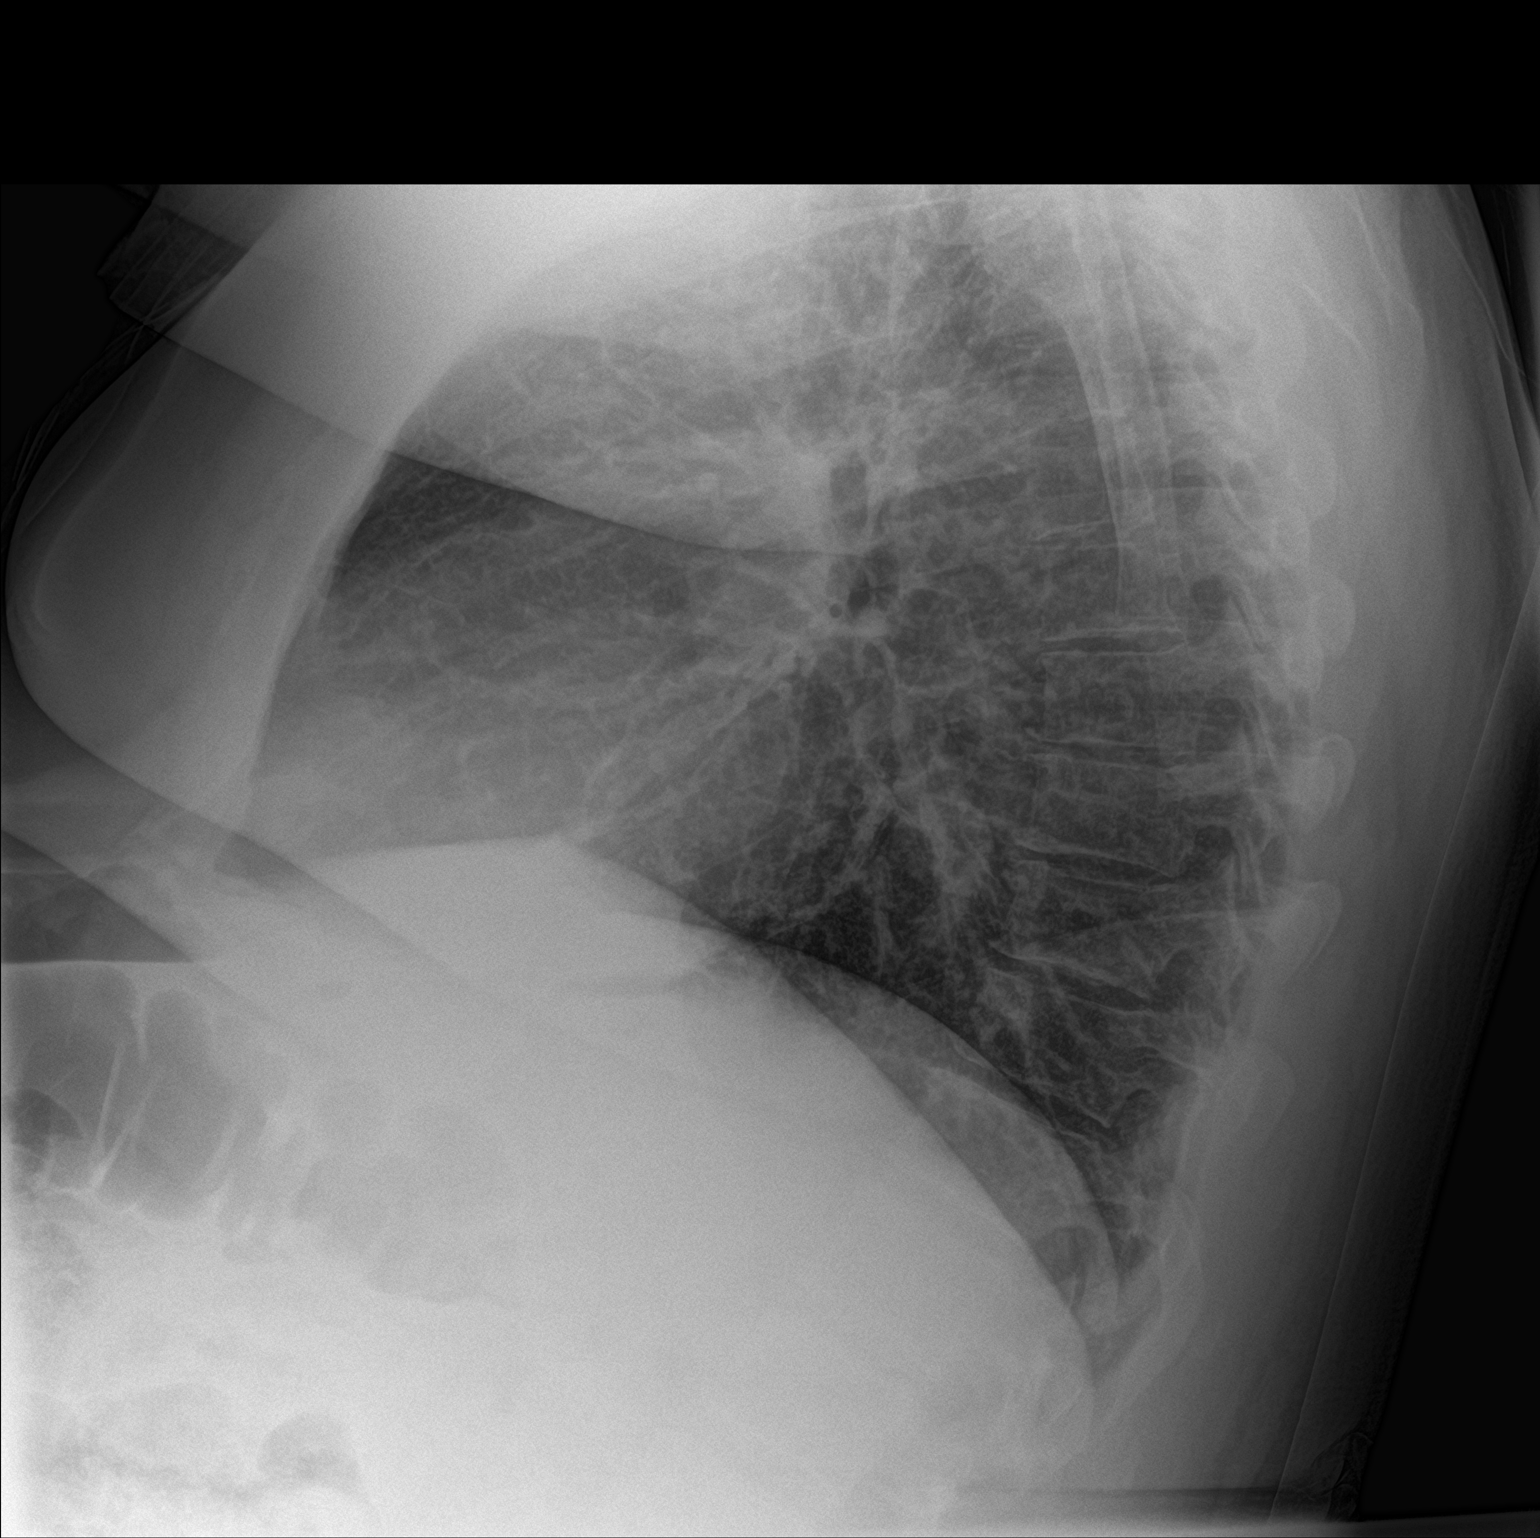
[im 2/2]
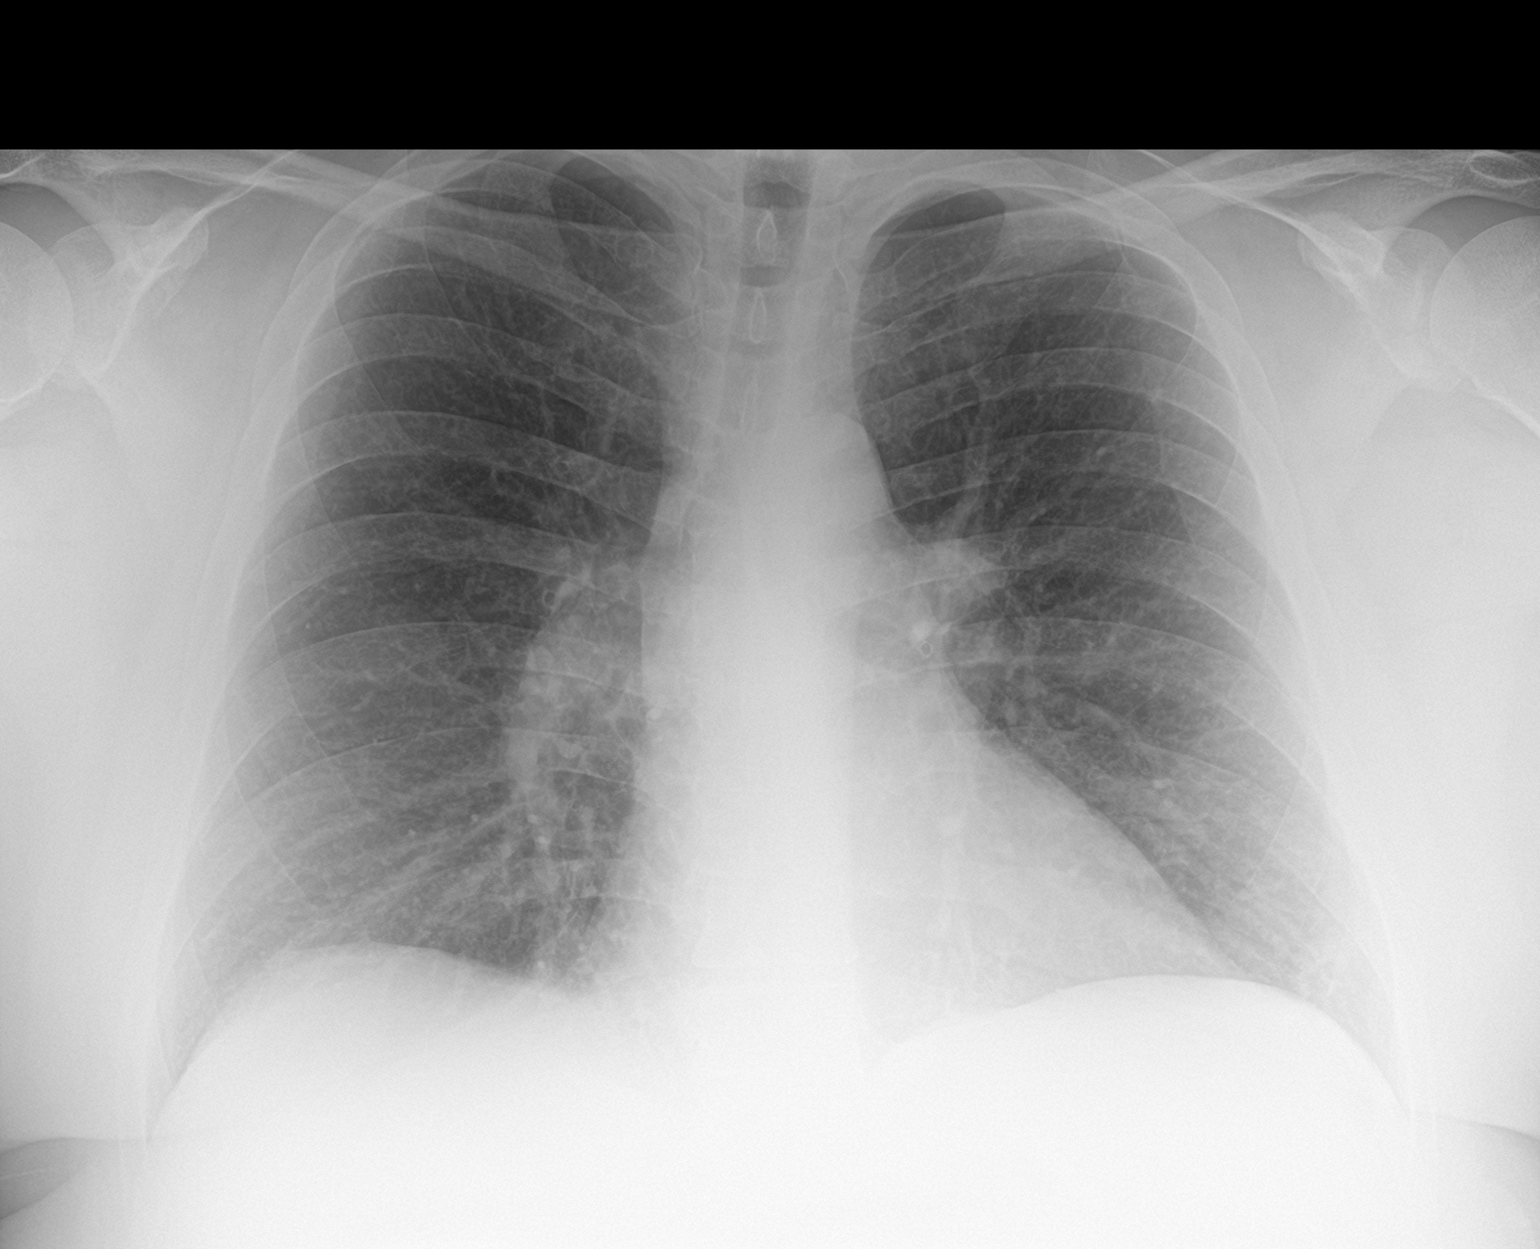

[2 of 2 positions shown; findings below may reference images not displayed]

FINDINGS: Heart and mediastinal contours are within normal limits. No focal
opacities or effusions. No acute bony abnormality.
IMPRESSION: No active cardiopulmonary disease.

## 2022-08-25 IMAGING — US US EXTREM LOW*R* LIMITED
1 series · 15 of 19 positions shown · non-contrast
Comparison: None.

CLINICAL DATA: Induration along the calf of the right leg.

EXAM:
ULTRASOUND RIGHT LOWER EXTREMITY LIMITED
TECHNIQUE: Ultrasound examination of the lower extremity soft tissues was
performed in the area of clinical concern.

[Series 1: us extrem low bilat ltd · 19 acquisitions, 15 frames shown]
[im 1/19]
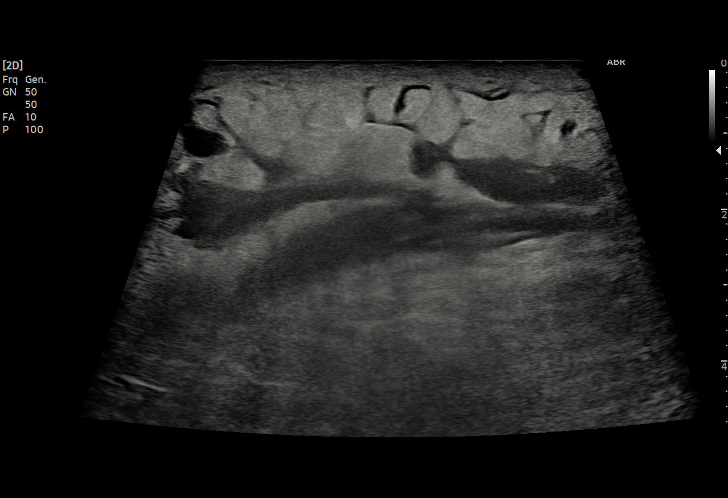
[im 2/19]
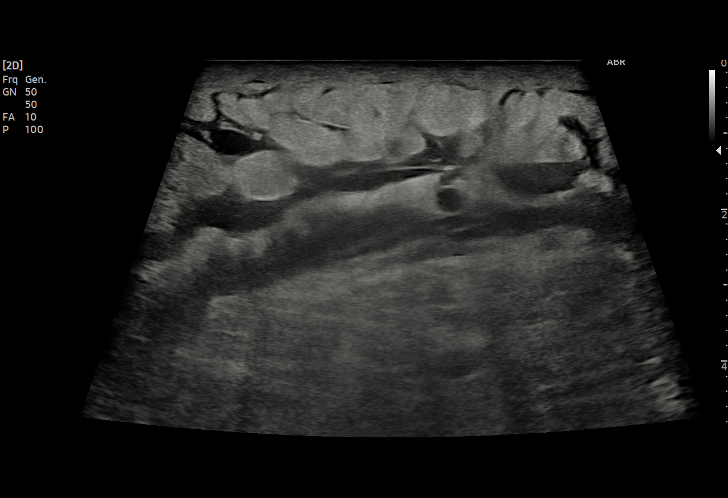
[im 4/19]
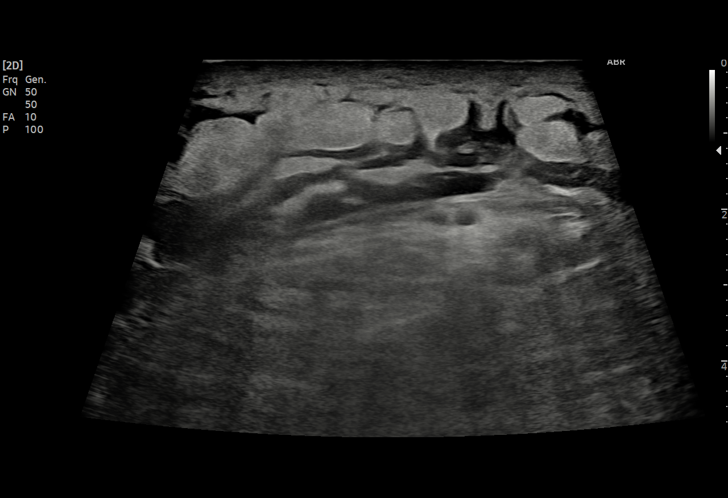
[im 5/19]
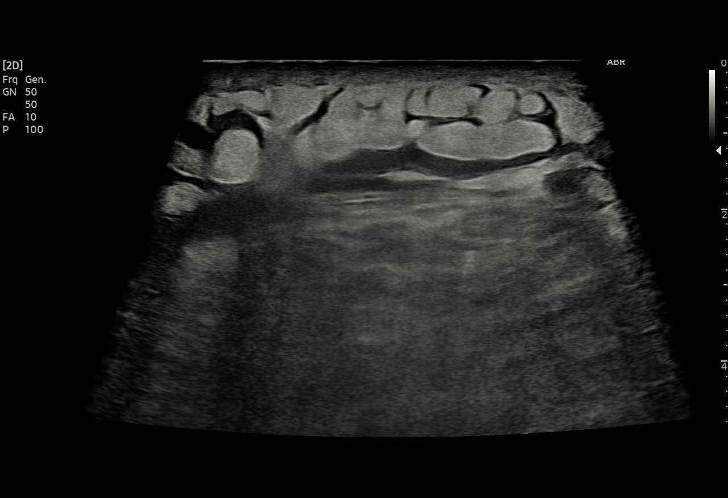
[im 6/19]
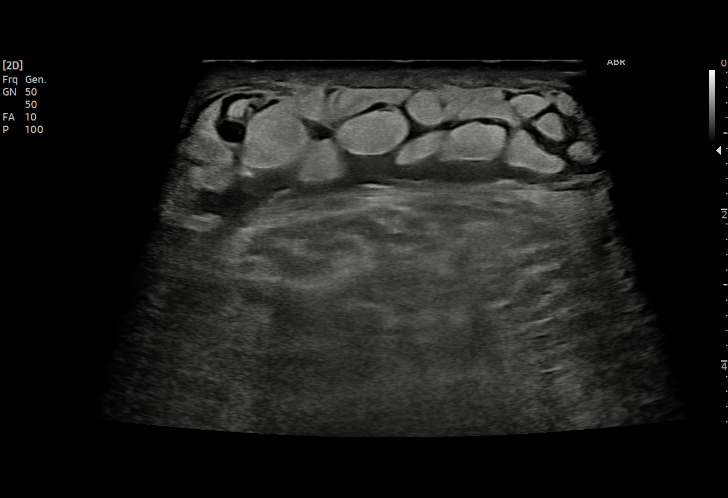
[im 7/19]
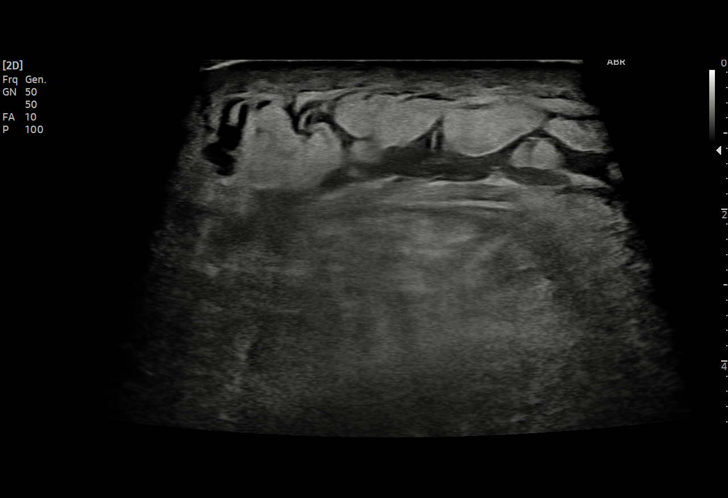
[im 9/19]
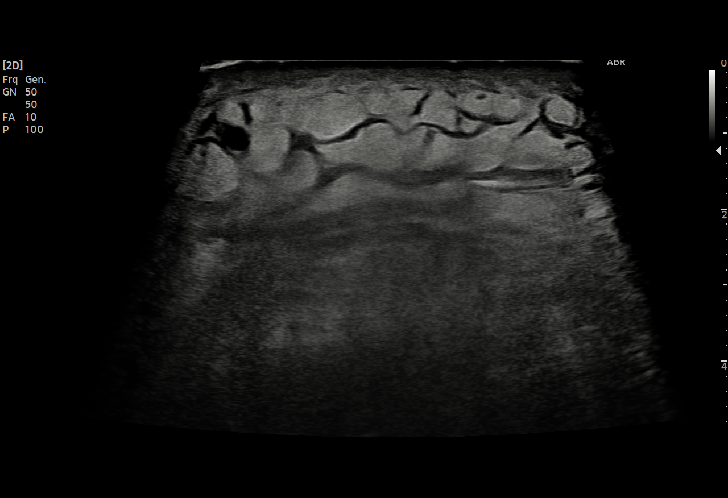
[im 10/19]
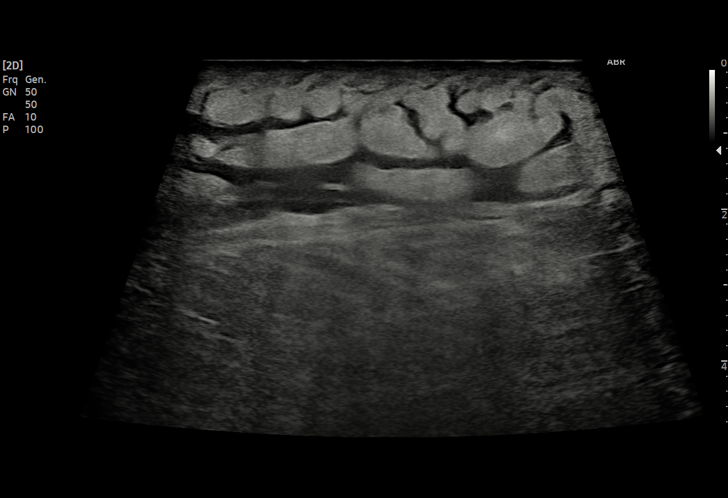
[im 11/19]
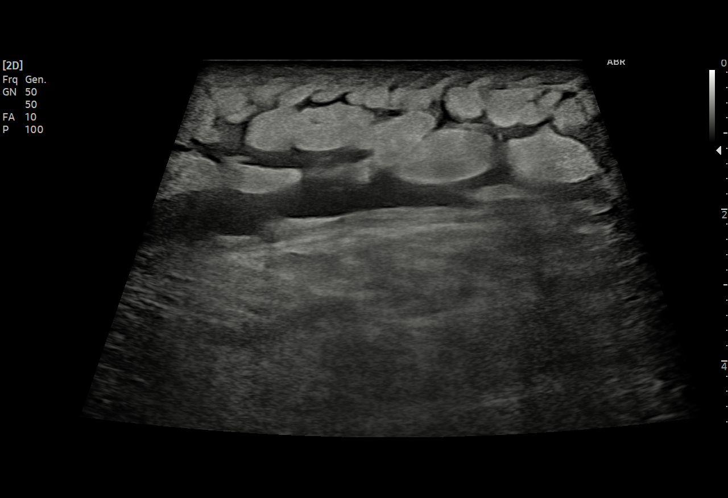
[im 13/19]
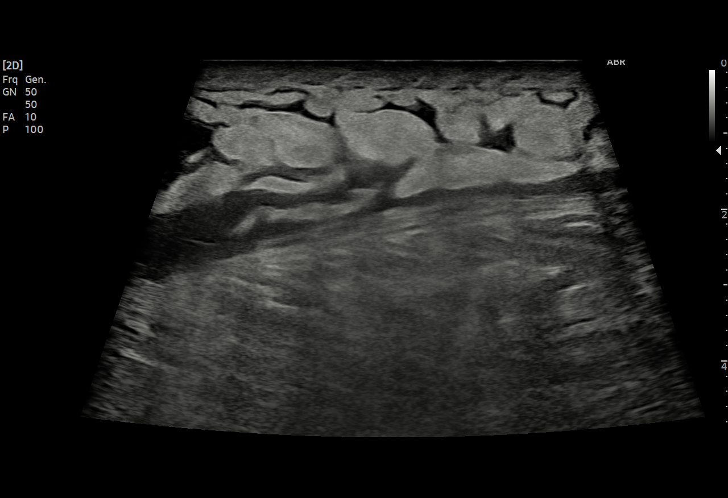
[im 14/19]
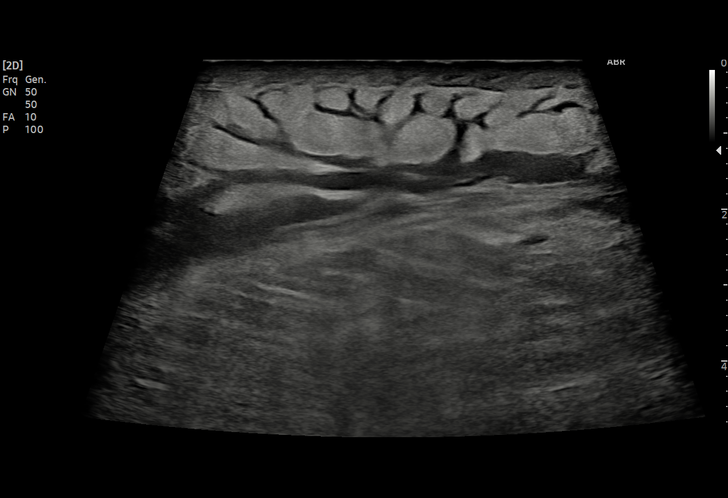
[im 15/19]
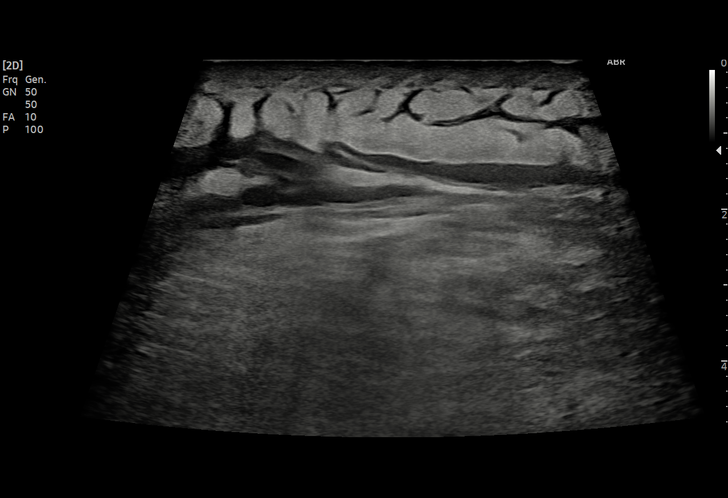
[im 16/19]
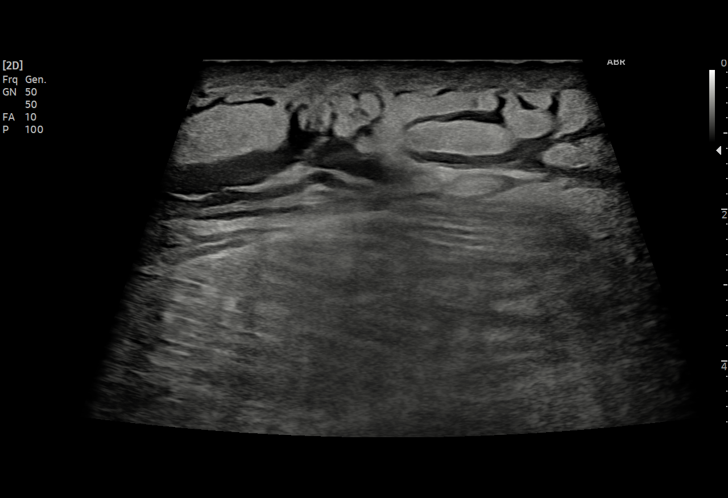
[im 18/19]
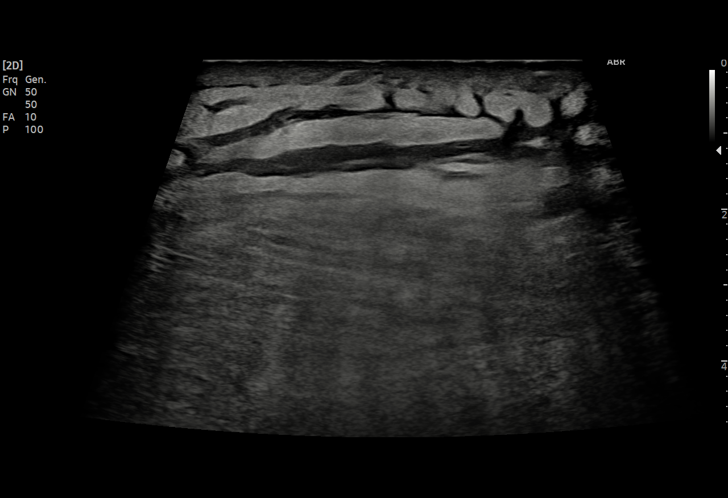
[im 19/19]
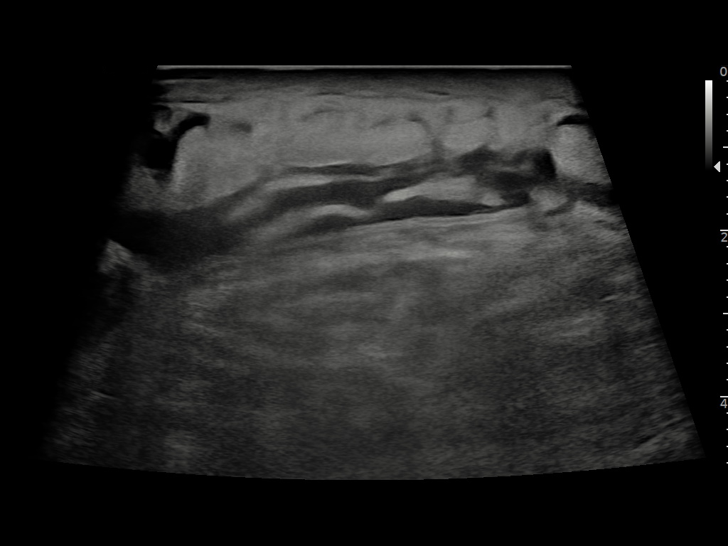

[15 of 19 positions shown; findings below may reference images not displayed]

FINDINGS: There is notable subcutaneous edema in the region of concern along
the right posterior calf. No discrete mass lesion is identified. No
drainable fluid collection observed.
IMPRESSION: 1. Infiltrative subcutaneous edema in the region of concern along
the posterior right calf.

## 2022-08-26 ENCOUNTER — Encounter (INDEPENDENT_AMBULATORY_CARE_PROVIDER_SITE_OTHER): Payer: Self-pay | Admitting: Nurse Practitioner

## 2022-08-26 NOTE — Progress Notes (Signed)
Subjective:    Patient ID: Brad [name], male    DOB: [birth year], 46 y.o.   MRN: [id number] Chief Complaint  Patient presents with   Follow-up    Unna boot check    The patient returns today for follow-up evaluation of his bilateral lower extremity following being in Unna boots.  The swelling is improved.  No evidence of cellulitis.  The patient initially did have evidence of cellulitis but this has resolved.    Review of Systems  Cardiovascular:  Positive for leg swelling.  All other systems reviewed and are negative.      Objective:   Physical Exam Vitals reviewed.  HENT:     Head: Normocephalic.  Cardiovascular:     Rate and Rhythm: Normal rate.  Pulmonary:     Effort: Pulmonary effort is normal.  Musculoskeletal:     Right lower leg: Edema present.     Left lower leg: Edema present.  Skin:    General: Skin is warm and dry.  Neurological:     Mental Status: He is alert and oriented to person, place, and time.  Psychiatric:        Mood and Affect: Mood normal.        Behavior: Behavior normal.        Thought Content: Thought content normal.        Judgment: Judgment normal.     BP (!) 143/70 (BP Location: Right Arm)   Pulse 66   Resp 16   Wt (!) 301 lb 12.8 oz (136.9 kg)   BMI 45.89 kg/m   Past Medical History:  Diagnosis Date   Basal cell carcinoma 07/25/2022   R lat neck - needs surgery   Chronic venous insufficiency of lower extremity    Hypertension     Social History   Socioeconomic History   Marital status: Single    Spouse name: Not on file   Number of children: Not on file   Years of education: Not on file   Highest education level: Not on file  Occupational History   Not on file  Tobacco Use   Smoking status: Some Days    Packs/day: 1.00    Years: 27.00    Total pack years: 27.00    Types: Cigarettes   Smokeless tobacco: Never  Vaping Use   Vaping Use: Never used  Substance and Sexual Activity   Alcohol use: Yes     Alcohol/week: 1.0 - 2.0 standard drink of alcohol    Types: 1 - 2 Standard drinks or equivalent per week   Drug use: Yes    Types: Marijuana   Sexual activity: Not on file  Other Topics Concern   Not on file  Social History Narrative   Not on file   Social Determinants of Health   Financial Resource Strain: Not on file  Food Insecurity: Not on file  Transportation Needs: Not on file  Physical Activity: Not on file  Stress: Not on file  Social Connections: Not on file  Intimate Partner Violence: Not on file    History reviewed. No pertinent surgical history.  Family History  Problem Relation Age of Onset   Leukemia Maternal Grandmother    Stroke Maternal Grandfather     No Known Allergies     Latest Ref Rng & Units 04/06/2022    4:36 PM 03/31/2022    6:03 AM 03/30/2022    6:47 AM  CBC  WBC 3.8 - 10.8 Thousand/uL 10.3  22.0  19.5   Hemoglobin 13.2 - 17.1 g/dL 13.8  12.0  12.0   Hematocrit 38.5 - 50.0 % 42.2  35.9  36.9   Platelets 140 - 400 Thousand/uL 772  535  468       CMP     Component Value Date/Time   NA 134 (L) 04/06/2022 1636   K 4.5 04/06/2022 1636   CL 95 (L) 04/06/2022 1636   CO2 28 04/06/2022 1636   GLUCOSE 118 04/06/2022 1636   BUN 14 04/06/2022 1636   CREATININE 0.76 04/06/2022 1636   CALCIUM 9.4 04/06/2022 1636   PROT 7.8 04/06/2022 1636   ALBUMIN 3.3 (L) 03/24/2022 0112   AST 24 04/06/2022 1636   ALT 22 04/06/2022 1636   ALKPHOS 45 03/24/2022 0112   BILITOT 0.3 04/06/2022 1636   GFRNONAA >60 03/31/2022 0603     No results found.     Assessment & Plan:   1. Swelling of limb The patient's swelling is much improved.  No open wounds or ulcerations.  No evidence of cellulitis.  Patient is advised to begin utilizing medical grade compression socks.  We will have the patient also begin to elevate his lower extremities and strive for activity.  The patient will follow-up in 6 weeks to evaluate progress with edema.  2. Sepsis due to cellulitis  [facility]) Patient did have recurrent episode of cellulitis however with treatment of antibiotics and did not progress to level of sepsis.  Currently no evidence of cellulitis.  3. Essential hypertension Continue antihypertensive medications as already ordered, these medications have been reviewed and there are no changes at this time.    Current Outpatient Medications on File Prior to Visit  Medication Sig Dispense Refill   amLODipine (NORVASC) 5 MG tablet Take 1 tablet (5 mg total) by mouth daily. 90 tablet 3   amoxicillin-clavulanate (AUGMENTIN) 875-125 MG tablet Take 1 tablet by mouth 2 (two) times daily.     amphetamine-dextroamphetamine (ADDERALL XR) 25 MG 24 hr capsule Take 25 mg by mouth every morning.     amphetamine-dextroamphetamine (ADDERALL) 10 MG tablet Take 5-10 mg by mouth daily.     Buprenorphine HCl-Naloxone HCl 8-2 MG FILM Place 1 strip under the tongue 2 (two) times daily.     furosemide (LASIX) 20 MG tablet Take 1 tablet (20 mg total) by mouth daily as needed for edema or fluid. 90 tablet 3   nicotine (NICODERM CQ - DOSED IN MG/24 HOURS) 21 mg/24hr patch 21 mg every morning.     valsartan (DIOVAN) 80 MG tablet Take 1 tablet (80 mg total) by mouth daily. 90 tablet 3   cefdinir (OMNICEF) 300 MG capsule Take 1 capsule (300 mg total) by mouth 2 (two) times daily. (Patient not taking: Reported on 07/31/2022) 14 capsule 0   No current facility-administered medications on file prior to visit.    There are no Patient Instructions on file for this visit. No follow-ups on file.   Kris Hartmann, NP

## 2022-09-06 DIAGNOSIS — F1120 Opioid dependence, uncomplicated: Secondary | ICD-10-CM | POA: Diagnosis not present

## 2022-09-18 ENCOUNTER — Encounter (INDEPENDENT_AMBULATORY_CARE_PROVIDER_SITE_OTHER): Payer: Self-pay | Admitting: Vascular Surgery

## 2022-09-18 ENCOUNTER — Ambulatory Visit (INDEPENDENT_AMBULATORY_CARE_PROVIDER_SITE_OTHER): Payer: BC Managed Care – PPO | Admitting: Vascular Surgery

## 2022-09-18 VITALS — BP 143/82 | HR 77 | Resp 18 | Ht 68.0 in | Wt 292.2 lb

## 2022-09-18 DIAGNOSIS — A419 Sepsis, unspecified organism: Secondary | ICD-10-CM

## 2022-09-18 DIAGNOSIS — L0390 Cellulitis, unspecified: Secondary | ICD-10-CM

## 2022-09-18 DIAGNOSIS — Z6841 Body Mass Index (BMI) 40.0 and over, adult: Secondary | ICD-10-CM

## 2022-09-18 DIAGNOSIS — I890 Lymphedema, not elsewhere classified: Secondary | ICD-10-CM

## 2022-09-18 DIAGNOSIS — I10 Essential (primary) hypertension: Secondary | ICD-10-CM | POA: Diagnosis not present

## 2022-09-18 NOTE — Progress Notes (Signed)
MRN : [id number]  AMOL DOMANSKI III is a 46 y.o. (Mar 22, 1976) male who presents with chief complaint of No chief complaint on file. Marland Kitchen  History of Present Illness: Patient returns today in follow up of his lymphedema and leg swelling.  He sprained his right ankle and his right leg has swollen up significantly more over the past week although she has gone down the last 2 days.  There is been no skin breakdown or weeping.  The left leg swelling is fairly mild at this point.  A couple of months ago, he was in [employer] for several weeks and this resulted in significant improvement.  He has just been wearing diabetic socks and I have recommended he get back into compression socks ASAP.  No fevers or chills.  No open wounds.  Current Outpatient Medications  Medication Sig Dispense Refill   amLODipine (NORVASC) 5 MG tablet Take 1 tablet (5 mg total) by mouth daily. 90 tablet 3   amphetamine-dextroamphetamine (ADDERALL XR) 25 MG 24 hr capsule Take 25 mg by mouth every morning.     amphetamine-dextroamphetamine (ADDERALL) 10 MG tablet Take 5-10 mg by mouth daily.     Buprenorphine HCl-Naloxone HCl 8-2 MG FILM Place 1 strip under the tongue 2 (two) times daily.     valsartan (DIOVAN) 80 MG tablet Take 1 tablet (80 mg total) by mouth daily. 90 tablet 3   amoxicillin-clavulanate (AUGMENTIN) 875-125 MG tablet Take 1 tablet by mouth 2 (two) times daily. (Patient not taking: Reported on 09/18/2022)     cefdinir (OMNICEF) 300 MG capsule Take 1 capsule (300 mg total) by mouth 2 (two) times daily. (Patient not taking: Reported on 07/31/2022) 14 capsule 0   furosemide (LASIX) 20 MG tablet Take 1 tablet (20 mg total) by mouth daily as needed for edema or fluid. (Patient not taking: Reported on 09/18/2022) 90 tablet 3   nicotine (NICODERM CQ - DOSED IN MG/24 HOURS) 21 mg/24hr patch 21 mg every morning. (Patient not taking: Reported on 09/18/2022)     No current facility-administered medications for this visit.     Past Medical History:  Diagnosis Date   Basal cell carcinoma 07/25/2022   R lat neck - needs surgery   Chronic venous insufficiency of lower extremity    Hypertension     No past surgical history on file.   Social History   Tobacco Use   Smoking status: Some Days    Packs/day: 1.00    Years: 27.00    Total pack years: 27.00    Types: Cigarettes   Smokeless tobacco: Never  Vaping Use   Vaping Use: Never used  Substance Use Topics   Alcohol use: Yes    Alcohol/week: 1.0 - 2.0 standard drink of alcohol    Types: 1 - 2 Standard drinks or equivalent per week   Drug use: Yes    Types: Marijuana      Family History  Problem Relation Age of Onset   Leukemia Maternal Grandmother    Stroke Maternal Grandfather      No Known Allergies  REVIEW OF SYSTEMS (Negative unless checked)   Constitutional: '[]'$ Weight loss  '[]'$ Fever  '[]'$ Chills Cardiac: '[]'$ Chest pain   '[]'$ Chest pressure   '[]'$ Palpitations   '[]'$ Shortness of breath when laying flat   '[]'$ Shortness of breath at rest   '[]'$ Shortness of breath with exertion. Vascular:  '[]'$ Pain in legs with walking   '[]'$ Pain in legs at rest   '[]'$ Pain in legs when laying flat   '[]'$   Claudication   '[]'$ Pain in feet when walking  '[]'$ Pain in feet at rest  '[]'$ Pain in feet when laying flat   '[]'$ History of DVT   '[]'$ Phlebitis   '[x]'$ Swelling in legs   '[]'$ Varicose veins   '[]'$ Non-healing ulcers Pulmonary:   '[]'$ Uses home oxygen   '[]'$ Productive cough   '[]'$ Hemoptysis   '[]'$ Wheeze  '[]'$ COPD   '[]'$ Asthma Neurologic:  '[]'$ Dizziness  '[]'$ Blackouts   '[]'$ Seizures   '[]'$ History of stroke   '[]'$ History of TIA  '[]'$ Aphasia   '[]'$ Temporary blindness   '[]'$ Dysphagia   '[]'$ Weakness or numbness in arms   '[]'$ Weakness or numbness in legs Musculoskeletal:  '[x]'$ Arthritis   '[]'$ Joint swelling   '[]'$ Joint pain   '[]'$ Low back pain Hematologic:  '[]'$ Easy bruising  '[]'$ Easy bleeding   '[]'$ Hypercoagulable state   '[]'$ Anemic  '[]'$ Hepatitis Gastrointestinal:  '[]'$ Blood in stool   '[]'$ Vomiting blood  '[]'$ Gastroesophageal reflux/heartburn   '[]'$ Abdominal  pain Genitourinary:  '[]'$ Chronic kidney disease   '[]'$ Difficult urination  '[]'$ Frequent urination  '[]'$ Burning with urination   '[]'$ Hematuria Skin:  '[]'$ Rashes   '[x]'$ Ulcers   '[x]'$ Wounds Psychological:  '[]'$ History of anxiety   '[]'$  History of major depression.  Physical Examination  BP (!) 143/82 (BP Location: Right Arm)   Pulse 77   Resp 18   Ht '5\' 8"'$  (1.727 m)   Wt 292 lb 3.2 oz (132.5 kg)   BMI 44.43 kg/m  Gen:  WD/WN, NAD. Obese.  Head: /AT, No temporalis wasting. Ear/Nose/Throat: Hearing grossly intact, nares w/o erythema or drainage Eyes: Conjunctiva clear. Sclera non-icteric Neck: Supple.  Trachea midline Pulmonary:  Good air movement, no use of accessory muscles.  Cardiac: RRR, no JVD Vascular:  Vessel Right Left  Radial Palpable Palpable           Musculoskeletal: M/S 5/5 throughout.  No deformity or atrophy. 2-3+ RLE edema, 1+ LLE edema. Neurologic: Sensation grossly intact in extremities.  Symmetrical.  Speech is fluent.  Psychiatric: Judgment intact, Mood & affect appropriate for pt's clinical situation. Dermatologic: No rashes or ulcers noted.  No cellulitis or open wounds.      Labs No results found for this or any previous visit (from the past 2160 hour(s)).  Radiology No results found.  Assessment/Plan Essential hypertension blood pressure control important in reducing the progression of atherosclerotic disease. On appropriate oral medications.     Morbid obesity with BMI of 50.0-59.9, adult (HCC) Weight loss will be significantly helpful in terms of reducing the pain and swelling in his legs.   Sepsis due to cellulitis [facility]) Likely had lymphedema prior to his infectious issue but this has dramatically worsened.  Lymphedema His right lower extremity swelling is prominent and he needs to get back into compression socks ASAP.  We will hold off on Unna boots as it is not nearly as bad as it was initially and he does not have any skin breakdown.  I given him a new  prescription for compression socks.  Of also recommend elevating his legs and being as active as possible.  We will see him back in 2 to 3 months and follow-up but he knows if the skin begins to breakdown areas weeping, he is to contact our office for [employer] placements.    Leotis Pain, MD  09/18/2022 4:16 PM    This note was created with Dragon medical transcription system.  Any errors from dictation are purely unintentional

## 2022-09-18 NOTE — Assessment & Plan Note (Signed)
His right lower extremity swelling is prominent and he needs to get back into compression socks ASAP.  We will hold off on Unna boots as it is not nearly as bad as it was initially and he does not have any skin breakdown.  I given him a new prescription for compression socks.  Of also recommend elevating his legs and being as active as possible.  We will see him back in 2 to 3 months and follow-up but he knows if the skin begins to breakdown areas weeping, he is to contact our office for [employer] placements.

## 2022-10-05 DIAGNOSIS — Z1389 Encounter for screening for other disorder: Secondary | ICD-10-CM | POA: Diagnosis not present

## 2022-10-05 DIAGNOSIS — F1120 Opioid dependence, uncomplicated: Secondary | ICD-10-CM | POA: Diagnosis not present

## 2022-10-05 DIAGNOSIS — Z79899 Other long term (current) drug therapy: Secondary | ICD-10-CM | POA: Diagnosis not present

## 2022-10-22 ENCOUNTER — Encounter (INDEPENDENT_AMBULATORY_CARE_PROVIDER_SITE_OTHER): Payer: Self-pay

## 2022-10-24 ENCOUNTER — Telehealth: Payer: Self-pay

## 2022-10-24 NOTE — Telephone Encounter (Signed)
-----   Message from Ralene Bathe, MD sent at 07/26/2022  5:13 PM EDT ----- Diagnosis Skin , right lat neck BASAL CELL CARCINOMA, NODULAR PATTERN, BASE INVOLVED  Cancer - BCC As suspected Schedule surgery

## 2022-10-24 NOTE — Telephone Encounter (Signed)
Left pt msg to call for bx results/sh 

## 2022-11-01 DIAGNOSIS — F1120 Opioid dependence, uncomplicated: Secondary | ICD-10-CM | POA: Diagnosis not present

## 2022-11-01 DIAGNOSIS — Z79899 Other long term (current) drug therapy: Secondary | ICD-10-CM | POA: Diagnosis not present

## 2022-11-01 DIAGNOSIS — Z1389 Encounter for screening for other disorder: Secondary | ICD-10-CM | POA: Diagnosis not present

## 2022-11-06 ENCOUNTER — Ambulatory Visit: Payer: BC Managed Care – PPO | Admitting: Family Medicine

## 2022-11-08 ENCOUNTER — Telehealth (INDEPENDENT_AMBULATORY_CARE_PROVIDER_SITE_OTHER): Payer: Self-pay

## 2022-11-08 NOTE — Telephone Encounter (Signed)
It it is up to his thigh, he should be seen in the ED as he likely needs IV antibiotics

## 2022-11-08 NOTE — Telephone Encounter (Signed)
Spoke with pt and let him know that he needed to go to ED for possible IV antibiotics he stated understanding.

## 2022-11-08 NOTE — Telephone Encounter (Signed)
Pt LVM stating that he needs antibiotics called in for his right leg being swollen and red up to the thigh for 2 days.  Pt states that he had a fever yesterday and thinks it is cellulitis.  Please advise

## 2022-12-11 ENCOUNTER — Ambulatory Visit (INDEPENDENT_AMBULATORY_CARE_PROVIDER_SITE_OTHER): Payer: BC Managed Care – PPO | Admitting: Vascular Surgery

## 2022-12-11 ENCOUNTER — Telehealth: Payer: Self-pay

## 2022-12-11 NOTE — Telephone Encounter (Signed)
Left voicemail to return my call

## 2022-12-11 NOTE — Telephone Encounter (Signed)
-----   Message from Ralene Bathe, MD sent at 07/26/2022  5:13 PM EDT ----- Diagnosis Skin , right lat neck BASAL CELL CARCINOMA, NODULAR PATTERN, BASE INVOLVED  Cancer - BCC As suspected Schedule surgery

## 2022-12-18 ENCOUNTER — Ambulatory Visit (INDEPENDENT_AMBULATORY_CARE_PROVIDER_SITE_OTHER): Payer: BC Managed Care – PPO | Admitting: Vascular Surgery

## 2023-01-22 ENCOUNTER — Telehealth: Payer: Self-pay

## 2023-01-22 NOTE — Telephone Encounter (Signed)
-----  Message from Ralene Bathe, MD sent at 07/26/2022  5:13 PM EDT ----- Diagnosis Skin , right lat neck BASAL CELL CARCINOMA, NODULAR PATTERN, BASE INVOLVED  Cancer - BCC As suspected Schedule surgery

## 2023-01-22 NOTE — Telephone Encounter (Signed)
Left message on voicemail to return my call.  

## 2023-04-12 DIAGNOSIS — Z79899 Other long term (current) drug therapy: Secondary | ICD-10-CM | POA: Diagnosis not present

## 2023-04-12 DIAGNOSIS — F902 Attention-deficit hyperactivity disorder, combined type: Secondary | ICD-10-CM | POA: Diagnosis not present

## 2023-04-12 DIAGNOSIS — F1120 Opioid dependence, uncomplicated: Secondary | ICD-10-CM | POA: Diagnosis not present

## 2023-04-12 DIAGNOSIS — F411 Generalized anxiety disorder: Secondary | ICD-10-CM | POA: Diagnosis not present

## 2023-04-17 ENCOUNTER — Telehealth: Payer: Self-pay

## 2023-04-17 NOTE — Telephone Encounter (Signed)
Left message on voicemail to return my call.  

## 2023-04-17 NOTE — Telephone Encounter (Signed)
-----   Message from David C Kowalski, MD sent at 07/26/2022  5:13 PM EDT ----- Diagnosis Skin , right lat neck BASAL CELL CARCINOMA, NODULAR PATTERN, BASE INVOLVED  Cancer - BCC As suspected Schedule surgery 

## 2023-05-10 DIAGNOSIS — F411 Generalized anxiety disorder: Secondary | ICD-10-CM | POA: Diagnosis not present

## 2023-05-10 DIAGNOSIS — F902 Attention-deficit hyperactivity disorder, combined type: Secondary | ICD-10-CM | POA: Diagnosis not present

## 2023-05-10 DIAGNOSIS — Z79899 Other long term (current) drug therapy: Secondary | ICD-10-CM | POA: Diagnosis not present

## 2023-05-10 DIAGNOSIS — F1120 Opioid dependence, uncomplicated: Secondary | ICD-10-CM | POA: Diagnosis not present

## 2023-06-07 DIAGNOSIS — F1120 Opioid dependence, uncomplicated: Secondary | ICD-10-CM | POA: Diagnosis not present

## 2023-06-07 DIAGNOSIS — Z79899 Other long term (current) drug therapy: Secondary | ICD-10-CM | POA: Diagnosis not present

## 2023-07-02 ENCOUNTER — Encounter: Payer: Self-pay | Admitting: Family Medicine

## 2023-07-02 ENCOUNTER — Ambulatory Visit (INDEPENDENT_AMBULATORY_CARE_PROVIDER_SITE_OTHER): Payer: BC Managed Care – PPO | Admitting: Family Medicine

## 2023-07-02 VITALS — BP 147/78 | HR 94 | Temp 98.8°F | Resp 18 | Ht 68.0 in | Wt 291.2 lb

## 2023-07-02 DIAGNOSIS — Z6841 Body Mass Index (BMI) 40.0 and over, adult: Secondary | ICD-10-CM

## 2023-07-02 DIAGNOSIS — I872 Venous insufficiency (chronic) (peripheral): Secondary | ICD-10-CM

## 2023-07-02 DIAGNOSIS — R600 Localized edema: Secondary | ICD-10-CM | POA: Diagnosis not present

## 2023-07-02 DIAGNOSIS — R7309 Other abnormal glucose: Secondary | ICD-10-CM | POA: Diagnosis not present

## 2023-07-02 DIAGNOSIS — I10 Essential (primary) hypertension: Secondary | ICD-10-CM

## 2023-07-02 LAB — CBC WITH DIFFERENTIAL/PLATELET
Absolute Monocytes: 438 cells/uL (ref 200–950)
Basophils Absolute: 60 cells/uL (ref 0–200)
Hemoglobin: 13.8 g/dL (ref 13.2–17.1)
Lymphs Abs: 2148 cells/uL (ref 850–3900)
MCH: 27.4 pg (ref 27.0–33.0)
MCHC: 33 g/dL (ref 32.0–36.0)
MCV: 83.1 fL (ref 80.0–100.0)
Monocytes Relative: 7.3 %
Neutrophils Relative %: 51.3 %
Platelets: 349 10*3/uL (ref 140–400)
RBC: 5.03 10*6/uL (ref 4.20–5.80)
RDW: 13 % (ref 11.0–15.0)
Total Lymphocyte: 35.8 %
WBC: 6 10*3/uL (ref 3.8–10.8)

## 2023-07-02 MED ORDER — AMLODIPINE BESYLATE 5 MG PO TABS: 5.0000 mg | ORAL_TABLET | Freq: Every day | ORAL | 5 refills | Status: DC

## 2023-07-02 MED ORDER — VALSARTAN 160 MG PO TABS: 160.0000 mg | ORAL_TABLET | Freq: Every day | ORAL | 5 refills | Status: DC

## 2023-07-02 NOTE — Progress Notes (Signed)
Subjective:    Patient ID: Brad [name], male    DOB: 07/06/1976, 47 y.o.   MRN: [id number]  Brad [name] is a 47 y.o. male presenting on 07/02/2023 for Hypertension (Pt been off blood pressure medication x 7 mths )   HPI  Hypertension Lympheema / Lower Ext Edema / Venous Stasis   Interval update since last visit 06/2022  He had some elevated BP off medication. Has completed meds.  Since last visit, he has established with Vascular specialist   Improved swelling with RICE therapy and elevation compression   He is improving with elevation   Previuosly has had Unna boot wraps and treatments. And Lymphedema pump     07/02/2023    3:22 PM 02/13/2022    3:53 PM 12/26/2021   10:47 AM  Depression screen PHQ 2/9  Decreased Interest 1 1 0  Down, Depressed, Hopeless 0 1 0  PHQ - 2 Score 1 2 0  Altered sleeping 2 2   Tired, decreased energy 1 2   Change in appetite 2 2   Feeling bad or failure about yourself  1 1   Trouble concentrating 2 2   Moving slowly or fidgety/restless 1 2   Suicidal thoughts 0    PHQ-9 Score 10 13   Difficult doing work/chores Somewhat difficult Somewhat difficult     Social History   Tobacco Use   Smoking status: Every Day    Packs/day: 1.00    Years: 27.00    Additional pack years: 0.00    Total pack years: 27.00    Types: Cigarettes   Smokeless tobacco: Never  Vaping Use   Vaping Use: Never used  Substance Use Topics   Alcohol use: Yes    Alcohol/week: 1.0 - 2.0 standard drink of alcohol    Types: 1 - 2 Standard drinks or equivalent per week    Comment: 1-2 monthly   Drug use: Yes    Types: Marijuana    Review of Systems Per HPI unless specifically indicated above     Objective:    BP (!) 147/78 (BP Location: Left Arm, Patient Position: Sitting, Cuff Size: Large)   Pulse 94   Temp 98.8 F (37.1 C) (Oral)   Resp 18   Ht 5\' 8"  (1.727 m)   Wt 291 lb 3.2 oz (132.1 kg)   SpO2 95%   BMI 44.28 kg/m   Wt Readings  from Last 3 Encounters:  07/02/23 291 lb 3.2 oz (132.1 kg)  09/18/22 292 lb 3.2 oz (132.5 kg)  08/07/22 (!) 301 lb 12.8 oz (136.9 kg)    Physical Exam Vitals and nursing note reviewed.  Constitutional:      General: He is not in acute distress.    Appearance: He is well-developed. He is obese. He is not diaphoretic.     Comments: Well-appearing, comfortable, cooperative  HENT:     Head: Normocephalic and atraumatic.  Eyes:     General:        Right eye: No discharge.        Left eye: No discharge.     Conjunctiva/sclera: Conjunctivae normal.  Neck:     Thyroid: No thyromegaly.  Cardiovascular:     Rate and Rhythm: Normal rate and regular rhythm.     Pulses: Normal pulses.     Heart sounds: Normal heart sounds. No murmur heard. Pulmonary:     Effort: Pulmonary effort is normal. No respiratory distress.     Breath  sounds: Normal breath sounds. No wheezing or rales.  Musculoskeletal:        General: Normal range of motion.     Cervical back: Normal range of motion and neck supple.     Right lower leg: Edema present.     Left lower leg: Edema present.  Lymphadenopathy:     Cervical: No cervical adenopathy.  Skin:    General: Skin is warm and dry.     Findings: No erythema or rash.  Neurological:     Mental Status: He is alert and oriented to person, place, and time. Mental status is at baseline.  Psychiatric:        Behavior: Behavior normal.     Comments: Well groomed, good eye contact, normal speech and thoughts       Results for orders placed or performed in visit on 04/06/22  COMPLETE METABOLIC PANEL WITH GFR  Result Value Ref Range   Glucose, Bld 118 65 - 139 mg/dL   BUN 14 7 - 25 mg/dL   Creat 9.14 7.82 - 9.56 mg/dL   eGFR 213 > OR = 60 YQ/MVH/8.46N6   BUN/Creatinine Ratio NOT APPLICABLE 6 - 22 (calc)   Sodium 134 (L) 135 - 146 mmol/L   Potassium 4.5 3.5 - 5.3 mmol/L   Chloride 95 (L) 98 - 110 mmol/L   CO2 28 20 - 32 mmol/L   Calcium 9.4 8.6 - 10.3 mg/dL    Total Protein 7.8 6.1 - 8.1 g/dL   Albumin 3.2 (L) 3.6 - 5.1 g/dL   Globulin 4.6 (H) 1.9 - 3.7 g/dL (calc)   AG Ratio 0.7 (L) 1.0 - 2.5 (calc)   Total Bilirubin 0.3 0.2 - 1.2 mg/dL   Alkaline phosphatase (APISO) 58 36 - 130 U/L   AST 24 10 - 40 U/L   ALT 22 9 - 46 U/L  CBC with Differential/Platelet  Result Value Ref Range   WBC 10.3 3.8 - 10.8 Thousand/uL   RBC 5.11 4.20 - 5.80 Million/uL   Hemoglobin 13.8 13.2 - 17.1 g/dL   HCT 29.5 28.4 - 13.2 %   MCV 82.6 80.0 - 100.0 fL   MCH 27.0 27.0 - 33.0 pg   MCHC 32.7 32.0 - 36.0 g/dL   RDW 44.0 10.2 - 72.5 %   Platelets 772 (H) 140 - 400 Thousand/uL   MPV 10.7 7.5 - 12.5 fL   Neutro Abs 6,664 1,500 - 7,800 cells/uL   Lymphs Abs 2,390 850 - 3,900 cells/uL   Absolute Monocytes 1,009 (H) 200 - 950 cells/uL   Eosinophils Absolute 144 15 - 500 cells/uL   Basophils Absolute 93 0 - 200 cells/uL   Neutrophils Relative % 64.7 %   Total Lymphocyte 23.2 %   Monocytes Relative 9.8 %   Eosinophils Relative 1.4 %   Basophils Relative 0.9 %      Assessment & Plan:   Problem List Items Addressed This Visit     Essential hypertension - Primary   Relevant Medications   valsartan (DIOVAN) 160 MG tablet   amLODipine (NORVASC) 5 MG tablet   Other Relevant Orders   COMPLETE METABOLIC PANEL WITH GFR   CBC with Differential/Platelet   Morbid obesity with BMI of 40.0-44.9, adult (HCC)   Venous insufficiency of both lower extremities   Relevant Medications   valsartan (DIOVAN) 160 MG tablet   amLODipine (NORVASC) 5 MG tablet   Other Visit Diagnoses     Bilateral lower extremity edema  Abnormal glucose       Relevant Orders   Hemoglobin A1c       HYPERTENSION Elevated BP Remain off HCTZ  Restart BP medications Amlodipine 5mg  daily Valsartan 160mg  daily  Okay to take extra fluid pill Furosemide 20 or 40mg  in future if swelling. Caution with prolonged use  Keep an eye on BP on this regimen. Monitor outside office if  able  Labs today to follow up on  Follow w/ Vascular as they manage the swelling / lymphedema  Morbid Obesity Consider weight management options in future    Orders Placed This Encounter  Procedures   COMPLETE METABOLIC PANEL WITH GFR   CBC with Differential/Platelet   Hemoglobin A1c     Meds ordered this encounter  Medications   valsartan (DIOVAN) 160 MG tablet    Sig: Take 1 tablet (160 mg total) by mouth daily.    Dispense:  30 tablet    Refill:  5   amLODipine (NORVASC) 5 MG tablet    Sig: Take 1 tablet (5 mg total) by mouth daily.    Dispense:  30 tablet    Refill:  5      Follow up plan: Return in about 6 months (around 01/02/2024) for 6 month follow-up HTN, updates.  Saralyn Pilar, DO [facility] 07/02/2023, 3:32 PM

## 2023-07-02 NOTE — Patient Instructions (Addendum)
Thank you for coming to the office today.  Restart BP medications  Amlodipine 5mg  daily Valsartan 160mg  daily  Okay to take extra fluid pill Furosemide 20 or 40mg  in future if swelling. Caution with prolonged use  Keep an eye on BP on this regimen.  Labs today to follow up on  Follow w/ Vascular as they manage the swelling  Please schedule a Follow-up Appointment to: Return in about 6 months (around 01/02/2024) for 6 month follow-up HTN, updates.  If you have any other questions or concerns, please feel free to call the office or send a message through MyChart. You may also schedule an earlier appointment if necessary.  Additionally, you may be receiving a survey about your experience at our office within a few days to 1 week by e-mail or mail. We value your feedback.  Saralyn Pilar, DO [facility], [place]

## 2023-07-03 LAB — COMPLETE METABOLIC PANEL WITH GFR
AG Ratio: 1.4 (calc) (ref 1.0–2.5)
ALT: 11 U/L (ref 9–46)
AST: 7 U/L — ABNORMAL LOW (ref 10–40)
Albumin: 3.7 g/dL (ref 3.6–5.1)
Alkaline phosphatase (APISO): 65 U/L (ref 36–130)
BUN: 14 mg/dL (ref 7–25)
CO2: 26 mmol/L (ref 20–32)
Calcium: 9 mg/dL (ref 8.6–10.3)
Chloride: 105 mmol/L (ref 98–110)
Creat: 0.78 mg/dL (ref 0.60–1.29)
Globulin: 2.7 g/dL (calc) (ref 1.9–3.7)
Glucose, Bld: 137 mg/dL (ref 65–139)
Potassium: 3.9 mmol/L (ref 3.5–5.3)
Sodium: 140 mmol/L (ref 135–146)
Total Bilirubin: 0.2 mg/dL (ref 0.2–1.2)
Total Protein: 6.4 g/dL (ref 6.1–8.1)
eGFR: 111 mL/min/{1.73_m2} (ref >=60)

## 2023-07-03 LAB — HEMOGLOBIN A1C
Hgb A1c MFr Bld: 6.1 % of total Hgb — ABNORMAL HIGH (ref <5.7)
Mean Plasma Glucose: 128 mg/dL
eAG (mmol/L): 7.1 mmol/L

## 2023-07-03 LAB — CBC WITH DIFFERENTIAL/PLATELET
Basophils Relative: 1 %
Eosinophils Absolute: 276 cells/uL (ref 15–500)
Eosinophils Relative: 4.6 %
HCT: 41.8 % (ref 38.5–50.0)
MPV: 11.4 fL (ref 7.5–12.5)
Neutro Abs: 3078 cells/uL (ref 1500–7800)

## 2023-08-02 DIAGNOSIS — F902 Attention-deficit hyperactivity disorder, combined type: Secondary | ICD-10-CM | POA: Diagnosis not present

## 2023-08-02 DIAGNOSIS — F1120 Opioid dependence, uncomplicated: Secondary | ICD-10-CM | POA: Diagnosis not present

## 2023-08-02 DIAGNOSIS — F411 Generalized anxiety disorder: Secondary | ICD-10-CM | POA: Diagnosis not present

## 2023-10-18 DIAGNOSIS — F902 Attention-deficit hyperactivity disorder, combined type: Secondary | ICD-10-CM | POA: Diagnosis not present

## 2023-10-18 DIAGNOSIS — F1120 Opioid dependence, uncomplicated: Secondary | ICD-10-CM | POA: Diagnosis not present

## 2023-10-18 DIAGNOSIS — F411 Generalized anxiety disorder: Secondary | ICD-10-CM | POA: Diagnosis not present

## 2024-01-03 ENCOUNTER — Ambulatory Visit: Payer: BC Managed Care – PPO | Admitting: Family Medicine

## 2024-01-15 ENCOUNTER — Other Ambulatory Visit: Payer: Self-pay | Admitting: Family Medicine

## 2024-01-15 DIAGNOSIS — I10 Essential (primary) hypertension: Secondary | ICD-10-CM

## 2024-01-15 NOTE — Telephone Encounter (Signed)
Patient called, left VM to return the call to the office to scheduled an appt for medication refill request.   

## 2024-01-15 NOTE — Telephone Encounter (Signed)
Courtesy refill.   Requested Prescriptions  Pending Prescriptions Disp Refills   amLODipine (NORVASC) 5 MG tablet [Pharmacy Med Name: AMLODIPINE BESYLATE 5MG  TABLETS] 30 tablet 5    Sig: TAKE 1 TABLET(5 MG) BY MOUTH DAILY     Cardiovascular: Calcium Channel Blockers 2 Failed - 01/15/2024 11:24 AM      Failed - Last BP in normal range    BP Readings from Last 1 Encounters:  07/02/23 (!) 147/78         Failed - Valid encounter within last 6 months    Recent Outpatient Visits           6 months ago Essential hypertension   [place] [facility] [place], Netta Neat, DO   1 year ago Essential hypertension   [place] [facility] Smitty Cords, DO   1 year ago Venous insufficiency of both lower extremities   [place] [facility] Smitty Cords, DO   1 year ago Cellulitis of right lower extremity   [place] [facility] [place], Netta Neat, DO   1 year ago Essential hypertension   [place] [facility] Smitty Cords, DO              Passed - Last Heart Rate in normal range    Pulse Readings from Last 1 Encounters:  07/02/23 94          valsartan (DIOVAN) 160 MG tablet [Pharmacy Med Name: VALSARTAN 160MG  TABLETS] 30 tablet 5    Sig: TAKE 1 TABLET(160 MG) BY MOUTH DAILY     Cardiovascular:  Angiotensin Receptor Blockers Failed - 01/15/2024 11:24 AM      Failed - Cr in normal range and within 180 days    Creat  Date Value Ref Range Status  07/02/2023 0.78 0.60 - 1.29 mg/dL Final         Failed - K in normal range and within 180 days    Potassium  Date Value Ref Range Status  07/02/2023 3.9 3.5 - 5.3 mmol/L Final         Failed - Last BP in normal range    BP Readings from Last 1 Encounters:  07/02/23 (!) 147/78         Failed - Valid encounter within last 6 months    Recent Outpatient Visits           6 months ago Essential  hypertension   [place] [facility] Smitty Cords, DO   1 year ago Essential hypertension   [place] [facility] Smitty Cords, DO   1 year ago Venous insufficiency of both lower extremities   [place] [facility] Smitty Cords, DO   1 year ago Cellulitis of right lower extremity   [place] [facility] Smitty Cords, DO   1 year ago Essential hypertension    [facility] Smitty Cords, [place] - Patient is not pregnant

## 2024-02-20 DIAGNOSIS — F902 Attention-deficit hyperactivity disorder, combined type: Secondary | ICD-10-CM | POA: Diagnosis not present

## 2024-02-20 DIAGNOSIS — F411 Generalized anxiety disorder: Secondary | ICD-10-CM | POA: Diagnosis not present

## 2024-02-20 DIAGNOSIS — F1120 Opioid dependence, uncomplicated: Secondary | ICD-10-CM | POA: Diagnosis not present

## 2024-03-06 ENCOUNTER — Other Ambulatory Visit: Payer: Self-pay | Admitting: Family Medicine

## 2024-03-06 DIAGNOSIS — I10 Essential (primary) hypertension: Secondary | ICD-10-CM

## 2024-03-06 NOTE — Telephone Encounter (Signed)
 Requested medication (s) are due for refill today: Yes  Requested medication (s) are on the active medication list: Yes  Last refill:  01/15/24  Future visit scheduled: No  Notes to clinic:  Left message to call and make appointment.    Requested Prescriptions  Pending Prescriptions Disp Refills   valsartan (DIOVAN) 160 MG tablet [Pharmacy Med Name: VALSARTAN 160MG  TABLETS] 30 tablet 0    Sig: TAKE 1 TABLET(160 MG) BY MOUTH DAILY     Cardiovascular:  Angiotensin Receptor Blockers Failed - 03/06/2024  3:40 PM      Failed - Cr in normal range and within 180 days    Creat  Date Value Ref Range Status  07/02/2023 0.78 0.60 - 1.29 mg/dL Final         Failed - K in normal range and within 180 days    Potassium  Date Value Ref Range Status  07/02/2023 3.9 3.5 - 5.3 mmol/L Final         Failed - Last BP in normal range    BP Readings from Last 1 Encounters:  07/02/23 (!) 147/78         Failed - Valid encounter within last 6 months    Recent Outpatient Visits           8 months ago Essential hypertension   [place] [facility] Smitty Cords, DO   1 year ago Essential hypertension   [facility] [facility] Smitty Cords, DO   1 year ago Venous insufficiency of both lower extremities   [place] [facility] Smitty Cords, DO   1 year ago Cellulitis of right lower extremity   [place] [facility] Smitty Cords, DO   2 years ago Essential hypertension   [place] [facility] Smitty Cords, [place] - Patient is not pregnant

## 2024-04-01 ENCOUNTER — Other Ambulatory Visit: Payer: Self-pay | Admitting: Family Medicine

## 2024-04-01 DIAGNOSIS — I10 Essential (primary) hypertension: Secondary | ICD-10-CM

## 2024-04-02 NOTE — Telephone Encounter (Signed)
 Requested medications are due for refill today.  yes  Requested medications are on the active medications list.  yes  Last refill. 01/15/2024 #30 0 rf  Future visit scheduled.   no  Notes to clinic.  Pt already given a courtesy refill. Pt last seen 06/2023.    Requested Prescriptions  Pending Prescriptions Disp Refills   amLODipine (NORVASC) 5 MG tablet [Pharmacy Med Name: AMLODIPINE BESYLATE 5MG  TABLETS] 30 tablet 0    Sig: TAKE 1 TABLET(5 MG) BY MOUTH DAILY     Cardiovascular: Calcium Channel Blockers 2 Failed - 04/02/2024  7:33 AM      Failed - Last BP in normal range    BP Readings from Last 1 Encounters:  07/02/23 (!) 147/78         Failed - Valid encounter within last 6 months    Recent Outpatient Visits   None            Passed - Last Heart Rate in normal range    Pulse Readings from Last 1 Encounters:  07/02/23 94

## 2024-07-09 DIAGNOSIS — F902 Attention-deficit hyperactivity disorder, combined type: Secondary | ICD-10-CM | POA: Diagnosis not present

## 2024-07-09 DIAGNOSIS — F411 Generalized anxiety disorder: Secondary | ICD-10-CM | POA: Diagnosis not present

## 2024-07-09 DIAGNOSIS — F1120 Opioid dependence, uncomplicated: Secondary | ICD-10-CM | POA: Diagnosis not present

## 2024-08-07 DIAGNOSIS — Z79899 Other long term (current) drug therapy: Secondary | ICD-10-CM | POA: Diagnosis not present

## 2024-08-17 ENCOUNTER — Ambulatory Visit: Payer: Self-pay

## 2024-08-17 DIAGNOSIS — C4441 Basal cell carcinoma of skin of scalp and neck: Secondary | ICD-10-CM

## 2024-08-17 NOTE — Telephone Encounter (Addendum)
 Tried calling patient regarding results. No answer. Unable to leave message will try again at later time.  ----- Message from Alm Rhyme sent at 07/26/2022  5:13 PM EDT ----- Diagnosis Skin , right lat neck BASAL CELL CARCINOMA, NODULAR PATTERN, BASE INVOLVED  Cancer - BCC As suspected Schedule surgery

## 2024-09-10 DIAGNOSIS — Z79899 Other long term (current) drug therapy: Secondary | ICD-10-CM | POA: Diagnosis not present

## 2024-09-21 DIAGNOSIS — F1120 Opioid dependence, uncomplicated: Secondary | ICD-10-CM | POA: Diagnosis not present

## 2024-09-21 DIAGNOSIS — F411 Generalized anxiety disorder: Secondary | ICD-10-CM | POA: Diagnosis not present

## 2024-09-21 DIAGNOSIS — F902 Attention-deficit hyperactivity disorder, combined type: Secondary | ICD-10-CM | POA: Diagnosis not present

## 2024-09-21 DIAGNOSIS — Z79899 Other long term (current) drug therapy: Secondary | ICD-10-CM | POA: Diagnosis not present

## 2024-09-25 ENCOUNTER — Encounter: Payer: Self-pay | Admitting: Family Medicine

## 2024-09-25 ENCOUNTER — Ambulatory Visit (INDEPENDENT_AMBULATORY_CARE_PROVIDER_SITE_OTHER): Admitting: Family Medicine

## 2024-09-25 ENCOUNTER — Other Ambulatory Visit: Payer: Self-pay | Admitting: Family Medicine

## 2024-09-25 VITALS — BP 138/78 | HR 95 | Ht 68.0 in | Wt 342.0 lb

## 2024-09-25 DIAGNOSIS — J9801 Acute bronchospasm: Secondary | ICD-10-CM | POA: Diagnosis not present

## 2024-09-25 DIAGNOSIS — I10 Essential (primary) hypertension: Secondary | ICD-10-CM

## 2024-09-25 DIAGNOSIS — I872 Venous insufficiency (chronic) (peripheral): Secondary | ICD-10-CM

## 2024-09-25 DIAGNOSIS — Z0000 Encounter for general adult medical examination without abnormal findings: Secondary | ICD-10-CM

## 2024-09-25 DIAGNOSIS — Z6841 Body Mass Index (BMI) 40.0 and over, adult: Secondary | ICD-10-CM

## 2024-09-25 DIAGNOSIS — I890 Lymphedema, not elsewhere classified: Secondary | ICD-10-CM

## 2024-09-25 DIAGNOSIS — R29818 Other symptoms and signs involving the nervous system: Secondary | ICD-10-CM

## 2024-09-25 DIAGNOSIS — G4719 Other hypersomnia: Secondary | ICD-10-CM

## 2024-09-25 DIAGNOSIS — R7309 Other abnormal glucose: Secondary | ICD-10-CM

## 2024-09-25 DIAGNOSIS — R600 Localized edema: Secondary | ICD-10-CM | POA: Diagnosis not present

## 2024-09-25 DIAGNOSIS — Z125 Encounter for screening for malignant neoplasm of prostate: Secondary | ICD-10-CM

## 2024-09-25 MED ORDER — ALBUTEROL SULFATE HFA 108 (90 BASE) MCG/ACT IN AERS: 2.0000 | INHALATION_SPRAY | Freq: Four times a day (QID) | RESPIRATORY_TRACT | 2 refills | Status: AC | PRN

## 2024-09-25 MED ORDER — AMLODIPINE BESYLATE 5 MG PO TABS: 5.0000 mg | ORAL_TABLET | Freq: Every day | ORAL | 1 refills | Status: AC

## 2024-09-25 MED ORDER — VALSARTAN 160 MG PO TABS: 160.0000 mg | ORAL_TABLET | Freq: Every day | ORAL | 1 refills | Status: AC

## 2024-09-25 MED ORDER — FUROSEMIDE 20 MG PO TABS: 20.0000 mg | ORAL_TABLET | Freq: Every day | ORAL | 1 refills | Status: AC | PRN

## 2024-09-25 NOTE — Patient Instructions (Addendum)
 Thank you for coming to the office today.  [facility] Home sleep study  Ordered Home Sleep Study Check cost, if it is covered, or affordable, let us  know - message or call and we can order for you.  24/7 Live Phone Support  We offer 24 hour patient support to answer testing-related questions.  Call: [phone]  [email or web address]  After the home sleep study, if moderate or severe sleep apnea, then you may qualify for coverage on the Zepbound through medicaid   Lymphedema for lower legs Referral back to Vascular  [facility] [address] [place],  [place]  [address] Main: [phone]  Breztri  2 puff twice a day  Call back or let me know next time if the sample inhaler works well. We can order more  DUE for FASTING BLOOD WORK (no food or drink after midnight before the lab appointment, only water or coffee without cream/sugar on the morning of)  SCHEDULE Lab Only visit in the morning at the clinic for lab draw in 6 WEEKS  - Make sure Lab Only appointment is at about 1 week before your next appointment, so that results will be available  For Lab Results, once available within 2-3 days of blood draw, you can can log in to MyChart online to view your results and a brief explanation. Also, we can discuss results at next follow-up visit.   Please schedule a Follow-up Appointment to: Return for 6 week fasting lab > 1 week later Annual Physical.  If you have any other questions or concerns, please feel free to call the office or send a message through MyChart. You may also schedule an earlier appointment if necessary.  Additionally, you may be receiving a survey about your experience at our office within a few days to 1 week by e-mail or mail. We value your feedback.  Marsa Officer, DO [facility], [place]

## 2024-09-25 NOTE — Progress Notes (Signed)
 Subjective:    Patient ID: Brad [name], male    DOB: 1976/10/05, 48 y.o.   MRN: [id number]  Brad [name] is a 48 y.o. male presenting on 09/25/2024 for Medical Management of Chronic Issues   HPI  Discussed the use of AI scribe software for clinical note transcription with the patient, who gave verbal consent to proceed.  History of Present Illness   Brad [name] is a 48 year old male with hypertension and lymphedema who presents for follow-up and medication refills.  Weight gain and obesity - Progressive weight gain over the past year, most pronounced between 2024-12-10 and June/July - Attributes weight gain to decreased physical activity, increased ice cream consumption, and unemployment - Interested in [employer] management options, including lifestyle modification and pharmacotherapy - Expresses interest in weight loss injections, inspired by a friend's success, but concerned about insurance coverage due to lack of diabetes diagnosis  Hypertension - Previously controlled on valsartan  160 mg and amlodipine , with blood pressure around 130/80 mmHg - Currently off antihypertensive medications due to running out of supply - Current blood pressure is 142/78 mmHg off medication - Prefers a 90-day supply of medication due to Medicare coverage  Lymphedema Lower extremity edema - Chronic lymphedema with significant swelling in lower extremities - Previously followed by Vascular specialist 2023 last visit Dr Marea [facility], was on unna boots before - Off diuretics now, improved on furosemide  - Walking increased, he says improves his swelling  Dyspnea and tobacco use - Shortness of breath, especially with ambulation - Attributes dyspnea in part to tobacco use - History of inhaler use in the past; interested in trying a sample inhaler again  Nocturia  Suspected sleep apnea - Reports that witnessed sleep apnea episodes stopped breathing - Snores and talks in his  sleep - No formal diagnosis of sleep apnea before - He is excessively tired and sleepy during the daytime - Interested in pursuing a home sleep study, as diagnosis may impact eligibility for certain weight loss medications  Mental Health / Chronic medication use - Continues buprenorphine  (Suboxone ) and Adderall, obtained from [facility] in [place]      Epworth Sleepiness Scale Total Score: 13 Sitting and reading - 3 Watching TV - 3 Sitting inactive in a public place - 2 As a passenger in a car for an hour without a break - 2 Lying down to rest in the afternoon when circumstances permit - 2 Sitting and talking to someone - 0 Sitting quietly after a lunch without alcohol - 1 In a car, while stopped for a few minutes in traffic - 0      09/25/2024    3:53 PM 07/02/2023    3:22 PM 02/13/2022    3:53 PM  Depression screen PHQ 2/9  Decreased Interest 1 1 1   Down, Depressed, Hopeless 1 0 1  PHQ - 2 Score 2 1 2   Altered sleeping 1 2 2   Tired, decreased energy 1 1 2   Change in appetite 1 2 2   Feeling bad or failure about yourself  0 1 1  Trouble concentrating 1 2 2   Moving slowly or fidgety/restless 1 1 2   Suicidal thoughts 0 0   PHQ-9 Score 7 10 13   Difficult doing work/chores Not difficult at all Somewhat difficult Somewhat difficult       09/25/2024    3:53 PM 07/02/2023    3:22 PM 02/13/2022    3:53 PM  GAD 7 : Generalized  Anxiety Score  Nervous, Anxious, on Edge 1 1 1   Control/stop worrying 1 1 2   Worry too much - different things 1 1 2   Trouble relaxing 1 1 2   Restless 1 1 2   Easily annoyed or irritable 2 1 2   Afraid - awful might happen 0 1 1  Total GAD 7 Score 7 7 12   Anxiety Difficulty Not difficult at all Not difficult at all Somewhat difficult     Past Medical History:  Diagnosis Date   Basal cell carcinoma 07/25/2022   R lat neck - needs surgery   Chronic venous insufficiency of lower extremity    Hypertension    History reviewed. No  pertinent surgical history. Social History   Socioeconomic History   Marital status: Single    Spouse name: Not on file   Number of children: Not on file   Years of education: Not on file   Highest education level: Not on file  Occupational History   Not on file  Tobacco Use   Smoking status: Every Day    Current packs/day: 1.00    Average packs/day: 1 pack/day for 27.0 years (27.0 ttl pk-yrs)    Types: Cigarettes   Smokeless tobacco: Never  Vaping Use   Vaping status: Never Used  Substance and Sexual Activity   Alcohol use: Yes    Alcohol/week: 1.0 - 2.0 standard drink of alcohol    Types: 1 - 2 Standard drinks or equivalent per week    Comment: 1-2 monthly   Drug use: Yes    Types: Marijuana   Sexual activity: Not on file  Other Topics Concern   Not on file  Social History Narrative   Not on file   Social Drivers of Health   Financial Resource Strain: Not on file  Food Insecurity: Not on file  Transportation Needs: Not on file  Physical Activity: Not on file  Stress: Not on file  Social Connections: Not on file  Intimate Partner Violence: Not on file   Family History  Problem Relation Age of Onset   Leukemia Maternal Grandmother    Stroke Maternal Grandfather    Current Outpatient Medications on File Prior to Visit  Medication Sig   amphetamine -dextroamphetamine  (ADDERALL XR) 25 MG 24 hr capsule Take 25 mg by mouth every morning.   amphetamine -dextroamphetamine  (ADDERALL) 10 MG tablet Take 5-10 mg by mouth daily.   Buprenorphine  HCl-Naloxone  HCl 8-2 MG FILM Place 1 strip under the tongue 2 (two) times daily.   sertraline (ZOLOFT) 100 MG tablet Take 100 mg by mouth every morning.   No current facility-administered medications on file prior to visit.    Review of Systems Per HPI unless specifically indicated above     Objective:    BP 138/78 (BP Location: Left Arm, Cuff Size: Normal)   Pulse 95   Ht 5' 8 (1.727 m)   Wt (!) 342 lb (155.1 kg)   SpO2  96%   BMI 52.00 kg/m   Wt Readings from Last 3 Encounters:  09/25/24 (!) 342 lb (155.1 kg)  07/02/23 291 lb 3.2 oz (132.1 kg)  09/18/22 292 lb 3.2 oz (132.5 kg)    Physical Exam Vitals and nursing note reviewed.  Constitutional:      General: He is not in acute distress.    Appearance: He is well-developed. He is obese. He is not diaphoretic.     Comments: Well-appearing, comfortable, cooperative  HENT:     Head: Normocephalic and atraumatic.  Eyes:  General:        Right eye: No discharge.        Left eye: No discharge.     Conjunctiva/sclera: Conjunctivae normal.  Neck:     Thyroid: No thyromegaly.  Cardiovascular:     Rate and Rhythm: Normal rate and regular rhythm.     Pulses: Normal pulses.     Heart sounds: Normal heart sounds. No murmur heard. Pulmonary:     Effort: Pulmonary effort is normal. No respiratory distress.     Breath sounds: Normal breath sounds. No wheezing or rales.  Musculoskeletal:        General: Normal range of motion.     Cervical back: Normal range of motion and neck supple.     Right lower leg: Edema (severe lymphedema bilateral lower extremities below knees with chronic stasis dermatitis. no ulceration or weeping or infection) present.     Left lower leg: Edema present.  Lymphadenopathy:     Cervical: No cervical adenopathy.  Skin:    General: Skin is warm and dry.     Findings: No erythema or rash.  Neurological:     Mental Status: He is alert and oriented to person, place, and time. Mental status is at baseline.  Psychiatric:        Behavior: Behavior normal.     Comments: Well groomed, good eye contact, normal speech and thoughts     Results for orders placed or performed in visit on 07/02/23  COMPLETE METABOLIC PANEL WITH GFR   Collection Time: 07/02/23  3:44 PM  Result Value Ref Range   Glucose, Bld 137 65 - 139 mg/dL   BUN 14 7 - 25 mg/dL   Creat 9.21 9.39 - 8.70 mg/dL   eGFR 888 > OR = 60 fO/fpw/8.26f7   BUN/Creatinine  Ratio SEE NOTE: 6 - 22 (calc)   Sodium 140 135 - 146 mmol/L   Potassium 3.9 3.5 - 5.3 mmol/L   Chloride 105 98 - 110 mmol/L   CO2 26 20 - 32 mmol/L   Calcium 9.0 8.6 - 10.3 mg/dL   Total Protein 6.4 6.1 - 8.1 g/dL   Albumin 3.7 3.6 - 5.1 g/dL   Globulin 2.7 1.9 - 3.7 g/dL (calc)   AG Ratio 1.4 1.0 - 2.5 (calc)   Total Bilirubin 0.2 0.2 - 1.2 mg/dL   Alkaline phosphatase (APISO) 65 36 - 130 U/L   AST 7 (L) 10 - 40 U/L   ALT 11 9 - 46 U/L  CBC with Differential/Platelet   Collection Time: 07/02/23  3:44 PM  Result Value Ref Range   WBC 6.0 3.8 - 10.8 Thousand/uL   RBC 5.03 4.20 - 5.80 Million/uL   Hemoglobin 13.8 13.2 - 17.1 g/dL   HCT 58.1 61.4 - 49.9 %   MCV 83.1 80.0 - 100.0 fL   MCH 27.4 27.0 - 33.0 pg   MCHC 33.0 32.0 - 36.0 g/dL   RDW 86.9 88.9 - 84.9 %   Platelets 349 140 - 400 Thousand/uL   MPV 11.4 7.5 - 12.5 fL   Neutro Abs 3,078 1,500 - 7,800 cells/uL   Lymphs Abs 2,148 850 - 3,900 cells/uL   Absolute Monocytes 438 200 - 950 cells/uL   Eosinophils Absolute 276 15 - 500 cells/uL   Basophils Absolute 60 0 - 200 cells/uL   Neutrophils Relative % 51.3 %   Total Lymphocyte 35.8 %   Monocytes Relative 7.3 %   Eosinophils Relative 4.6 %   Basophils Relative 1.0 %  Hemoglobin A1c   Collection Time: 07/02/23  3:44 PM  Result Value Ref Range   Hgb A1c MFr Bld 6.1 (H) <5.7 % of total Hgb   Mean Plasma Glucose 128 mg/dL   eAG (mmol/L) 7.1 mmol/L      Assessment & Plan:   Problem List Items Addressed This Visit     Essential hypertension - Primary   Relevant Medications   valsartan  (DIOVAN ) 160 MG tablet   amLODipine  (NORVASC ) 5 MG tablet   furosemide  (LASIX ) 20 MG tablet   Lymphedema   Relevant Medications   furosemide  (LASIX ) 20 MG tablet   Morbid obesity with BMI of 50.0-59.9, adult (HCC)   Venous insufficiency of both lower extremities   Relevant Medications   valsartan  (DIOVAN ) 160 MG tablet   amLODipine  (NORVASC ) 5 MG tablet   furosemide  (LASIX ) 20 MG  tablet   Other Visit Diagnoses       Bilateral lower extremity edema       Relevant Medications   furosemide  (LASIX ) 20 MG tablet     Acute bronchospasm       Relevant Medications   albuterol (VENTOLIN HFA) 108 (90 Base) MCG/ACT inhaler     Suspected sleep apnea       Relevant Orders   Home sleep test     Excessive daytime sleepiness       Relevant Orders   Home sleep test        Hypertension Uncontrolled hypertension, previously managed with valsartan  and amlodipine . Improved BP on re-check still overall uncontrolled - Prescribe valsartan  160 mg and amlodipine  for 90 days with refills at [facility].  Lymphedema and chronic lower extremity edema Chronic lymphedema with significant lower extremity swelling, potential infection risk, and mobility impact. Stasis dermatitis. No complications today Previously w/ unna boots and lymphedema management  - Refer to vascular specialist for evaluation and management. - Prescribe furosemide  20 mg as needed for edema, 90-day supply. - Encourage regular movement and exercise.  Shortness of breath on exertion Shortness of breath possibly due to deconditioning, weight gain, and smoking. Differential includes COPD or emphysema. - Provide Breztri inhaler sample to assess response. If successful we can order Symbicort for him. - Prescribe rescue inhaler for acute episodes. - Encourage smoking cessation and consider further evaluation if symptoms persist.  Morbid Obesity BMI >52 Abnormal weight gain +50 lbs in past  Weight gain linked to decreased activity and dietary changes. Interest in weight loss medication contingent on sleep apnea diagnosis. Now w/ medicaid coverage rules for GLP1 - He cannot take Contrave or Phentermine or Qsymia due to [facility] management  - Order home sleep study through [facility] to evaluate for sleep apnea. - Encourage lifestyle modifications including diet and exercise.  Suspected obstructive sleep  apnea Complicated by morbid obesity - Order home sleep study through [facility] to confirm diagnosis. If moderate to severe OSA, we can likely pursue rx Zepbound GLP1/GIP for weight management   Nicotine  dependence Ongoing nicotine  dependence with associated shortness of breath. - Provide support and resources for smoking cessation.      Consider VBCI referral if need further care coordination.    Orders Placed This Encounter  Procedures   Ambulatory referral to Vascular Surgery    Referral Priority:   Routine    Referral Type:   Surgical    Referral Reason:   Specialty Services Required    Requested Specialty:   Vascular Surgery    Number of Visits Requested:   1  Home sleep test    [facility], [facility]    Where should this test be performed::   Other    Meds ordered this encounter  Medications   valsartan  (DIOVAN ) 160 MG tablet    Sig: Take 1 tablet (160 mg total) by mouth daily.    Dispense:  90 tablet    Refill:  1   amLODipine  (NORVASC ) 5 MG tablet    Sig: Take 1 tablet (5 mg total) by mouth daily.    Dispense:  90 tablet    Refill:  1   furosemide  (LASIX ) 20 MG tablet    Sig: Take 1 tablet (20 mg total) by mouth daily as needed for edema or fluid.    Dispense:  90 tablet    Refill:  1   albuterol (VENTOLIN HFA) 108 (90 Base) MCG/ACT inhaler    Sig: Inhale 2 puffs into the lungs every 6 (six) hours as needed for wheezing or shortness of breath.    Dispense:  8 g    Refill:  2     Follow up plan: Return for 6 week fasting lab > 1 week later Annual Physical.  11/06/24 future labs  Marsa Officer, DO [facility] 09/25/2024, 3:20 PM

## 2024-10-13 NOTE — Addendum Note (Signed)
 Addended by: TANDA SETTER A on: 10/13/2024 12:17 PM   Modules accepted: Orders

## 2024-10-13 NOTE — Telephone Encounter (Signed)
 Consulted with Dr. Raymund about the surgery to treat area at right latera neck for bx proven BCC. Dr. Raymund feels Mohs surgeon would be best.   Called patient and discussed with patient. Informed patient will refer to Dr. Corey a Mohs surgeon in [place].   Patient also scheduled  appt in January for skin cancer screening with Dr. Hester

## 2024-10-13 NOTE — Telephone Encounter (Addendum)
 Called patient he had reviewed information sent to him and wanted to schedule treatment. Scheduled patient to come in for surgery with Dr. Raymund. Will also need to schedule him a follow up to be seen in future. ----- Message from Alm Rhyme sent at 07/26/2022  5:13 PM EDT ----- Diagnosis Skin , right lat neck BASAL CELL CARCINOMA, NODULAR PATTERN, BASE INVOLVED  Cancer - BCC As suspected Schedule surgery

## 2024-10-16 ENCOUNTER — Ambulatory Visit (INDEPENDENT_AMBULATORY_CARE_PROVIDER_SITE_OTHER): Admitting: Vascular Surgery

## 2024-10-20 ENCOUNTER — Encounter

## 2024-10-27 ENCOUNTER — Ambulatory Visit (INDEPENDENT_AMBULATORY_CARE_PROVIDER_SITE_OTHER): Admitting: Vascular Surgery

## 2024-10-30 DIAGNOSIS — F411 Generalized anxiety disorder: Secondary | ICD-10-CM | POA: Diagnosis not present

## 2024-10-30 DIAGNOSIS — F1120 Opioid dependence, uncomplicated: Secondary | ICD-10-CM | POA: Diagnosis not present

## 2024-10-30 DIAGNOSIS — F902 Attention-deficit hyperactivity disorder, combined type: Secondary | ICD-10-CM | POA: Diagnosis not present

## 2024-10-30 DIAGNOSIS — Z79899 Other long term (current) drug therapy: Secondary | ICD-10-CM | POA: Diagnosis not present

## 2024-11-06 ENCOUNTER — Other Ambulatory Visit

## 2024-11-06 DIAGNOSIS — Z125 Encounter for screening for malignant neoplasm of prostate: Secondary | ICD-10-CM

## 2024-11-06 DIAGNOSIS — Z0000 Encounter for general adult medical examination without abnormal findings: Secondary | ICD-10-CM

## 2024-11-06 DIAGNOSIS — I10 Essential (primary) hypertension: Secondary | ICD-10-CM

## 2024-11-06 DIAGNOSIS — I890 Lymphedema, not elsewhere classified: Secondary | ICD-10-CM

## 2024-11-06 DIAGNOSIS — R7309 Other abnormal glucose: Secondary | ICD-10-CM

## 2024-11-17 ENCOUNTER — Encounter: Admitting: Family Medicine

## 2024-12-06 ENCOUNTER — Other Ambulatory Visit: Payer: Self-pay

## 2024-12-06 ENCOUNTER — Emergency Department

## 2024-12-06 ENCOUNTER — Emergency Department
Admission: EM | Admit: 2024-12-06 | Discharge: 2024-12-06 | Disposition: A | Discharge status: Home / Self Care | Attending: Emergency Medicine | Admitting: Emergency Medicine

## 2024-12-06 ENCOUNTER — Encounter: Payer: Self-pay | Admitting: *Deleted

## 2024-12-06 DIAGNOSIS — R0602 Shortness of breath: Secondary | ICD-10-CM | POA: Diagnosis not present

## 2024-12-06 DIAGNOSIS — L03115 Cellulitis of right lower limb: Secondary | ICD-10-CM | POA: Insufficient documentation

## 2024-12-06 DIAGNOSIS — R600 Localized edema: Secondary | ICD-10-CM | POA: Insufficient documentation

## 2024-12-06 DIAGNOSIS — M7989 Other specified soft tissue disorders: Secondary | ICD-10-CM | POA: Diagnosis present

## 2024-12-06 DIAGNOSIS — L0390 Cellulitis, unspecified: Secondary | ICD-10-CM

## 2024-12-06 LAB — COMPREHENSIVE METABOLIC PANEL WITH GFR
ALT: 20 U/L (ref 0–44)
AST: 27 U/L (ref 15–41)
Albumin: 4 g/dL (ref 3.5–5.0)
Alkaline Phosphatase: 85 U/L (ref 38–126)
Anion gap: 11 (ref 5–15)
BUN: 19 mg/dL (ref 6–20)
CO2: 26 mmol/L (ref 22–32)
Calcium: 9.1 mg/dL (ref 8.9–10.3)
Chloride: 107 mmol/L (ref 98–111)
Creatinine, Ser: 0.7 mg/dL (ref 0.61–1.24)
GFR, Estimated: >60 mL/min (ref >60)
Glucose, Bld: 147 mg/dL — ABNORMAL HIGH (ref 70–99)
Potassium: 4.2 mmol/L (ref 3.5–5.1)
Sodium: 144 mmol/L (ref 135–145)
Total Bilirubin: <0.2 mg/dL (ref 0.0–1.2)
Total Protein: 7.5 g/dL (ref 6.5–8.1)

## 2024-12-06 LAB — CBC WITH DIFFERENTIAL/PLATELET
Abs Immature Granulocytes: 0.03 K/uL (ref 0.00–0.07)
Basophils Absolute: 0.1 K/uL (ref 0.0–0.1)
Basophils Relative: 1 %
Eosinophils Absolute: 0.3 K/uL (ref 0.0–0.5)
Eosinophils Relative: 4 %
HCT: 40.9 % (ref 39.0–52.0)
Hemoglobin: 13.2 g/dL (ref 13.0–17.0)
Immature Granulocytes: 0 %
Lymphocytes Relative: 35 %
Lymphs Abs: 2.5 K/uL (ref 0.7–4.0)
MCH: 27.4 pg (ref 26.0–34.0)
MCHC: 32.3 g/dL (ref 30.0–36.0)
MCV: 85 fL (ref 80.0–100.0)
Monocytes Absolute: 0.6 K/uL (ref 0.1–1.0)
Monocytes Relative: 9 %
Neutro Abs: 3.7 K/uL (ref 1.7–7.7)
Neutrophils Relative %: 51 %
Platelets: 295 K/uL (ref 150–400)
RBC: 4.81 MIL/uL (ref 4.22–5.81)
RDW: 13.4 % (ref 11.5–15.5)
WBC: 7.2 K/uL (ref 4.0–10.5)
nRBC: 0 % (ref 0.0–0.2)

## 2024-12-06 LAB — PRO BRAIN NATRIURETIC PEPTIDE: Pro Brain Natriuretic Peptide: 81.7 pg/mL (ref <300.0)

## 2024-12-06 LAB — TROPONIN T, HIGH SENSITIVITY: Troponin T High Sensitivity: <15 ng/L (ref 0–19)

## 2024-12-06 LAB — LACTIC ACID, PLASMA: Lactic Acid, Venous: 1.3 mmol/L (ref 0.5–1.9)

## 2024-12-06 MED ORDER — CEPHALEXIN 500 MG PO CAPS: 500.0000 mg | ORAL_CAPSULE | Freq: Three times a day (TID) | ORAL | 0 refills | Status: AC

## 2024-12-06 MED ORDER — FUROSEMIDE 10 MG/ML IJ SOLN
60.0000 mg | Freq: Once | INTRAMUSCULAR | Status: AC
  Administered 2024-12-06: 60 mg via INTRAVENOUS
  Filled 2024-12-06: qty 8

## 2024-12-06 MED ORDER — SODIUM CHLORIDE 0.9 % IV SOLN
1.0000 g | Freq: Once | INTRAVENOUS | Status: AC
  Administered 2024-12-06: 1 g via INTRAVENOUS
  Filled 2024-12-06: qty 10

## 2024-12-06 NOTE — Discharge Instructions (Addendum)
 As we discussed please double your dose of your lasix  (fluid pill) for the next 3 days. Please talk to your doctor about having a sleep study done.

## 2024-12-06 NOTE — ED Triage Notes (Signed)
 Pt reports that he has bilateral leg swelling and bumped into something and hit his leg (anterior left leg wound, clear drainage) and he says it is not healing. Denies fevers. Right leg more swollen than left. Has lasix  at home to take as need for swelling, last took about 4 days ago.

## 2024-12-06 NOTE — ED Provider Notes (Signed)
 [facility] Provider Note    Event Date/Time   First MD Initiated Contact with Patient 12/06/24 [phone]     (approximate)   History   Leg Swelling   HPI  Brad [name] is a 48 y.o. male who presents to the emergency department today because of concerns for wound to his right leg.  He states that he hit it against a piece of furniture about 3 weeks ago.  Since then he has had an unhealing wound.  He is concerned that there might be infection there.  States that he has had to be hospitalized for cellulitis of his legs in the past.  Both legs are swollen which the patient states has been an issue for a long time.  His right leg is typically worse than his left leg.  He does also state he has been having some shortness of breath with exertion.  Denies any shortness of breath at rest.     Physical Exam   Triage Vital Signs: ED Triage Vitals  Encounter Vitals Group     BP 12/06/24 0531 (!) 153/87     Girls Systolic BP Percentile --      Girls Diastolic BP Percentile --      Boys Systolic BP Percentile --      Boys Diastolic BP Percentile --      Pulse Rate 12/06/24 0531 86     Resp 12/06/24 0531 20     Temp 12/06/24 0531 97.9 F (36.6 C)     Temp Source 12/06/24 0531 Oral     SpO2 12/06/24 0531 96 %     Weight --      Height --      Head Circumference --      Peak Flow --      Pain Score 12/06/24 0532 1     Pain Loc --      Pain Education --      Exclude from Growth Chart --     Most recent vital signs: Vitals:   12/06/24 0531  BP: (!) 153/87  Pulse: 86  Resp: 20  Temp: 97.9 F (36.6 C)  SpO2: 96%   General: Awake, alert, oriented. CV:  Good peripheral perfusion. Regular rate and rhythm. Resp:  Normal effort. Lungs clear. Abd:  No distention.  Other:  Bilateral lower extremity edema. Non pitting. Roughly 2 cm wound to right shin with slightly surrounding erythema.    ED Results / Procedures / Treatments   Labs (all labs ordered  are listed, but only abnormal results are displayed) Labs Reviewed  COMPREHENSIVE METABOLIC PANEL WITH GFR - Abnormal; Notable for the following components:      Result Value   Glucose, Bld 147 (*)    All other components within normal limits  LACTIC ACID, PLASMA  CBC WITH DIFFERENTIAL/PLATELET  PRO BRAIN NATRIURETIC PEPTIDE  TROPONIN T, HIGH SENSITIVITY     EKG  None   RADIOLOGY I independently interpreted and visualized the CXR. My interpretation: No pneumonia Radiology interpretation:  IMPRESSION:  1. Pulmonary vascular congestion without overt pulmonary edema.  2. Subtle atelectasis or infiltrate not excluded in the lung bases.      PROCEDURES:  Critical Care performed: No    MEDICATIONS ORDERED IN ED: Medications - No data to display   IMPRESSION / MDM / ASSESSMENT AND PLAN / ED COURSE  I reviewed the triage vital signs and the nursing notes.  Differential diagnosis includes, but is not limited to, CHF, lymphedema, cellulitis  Patient's presentation is most consistent with acute presentation with potential threat to life or bodily function.   Patient presented to the emergency department today with primary concern for wound to his right shin, also complaining of some shortness of breath.  In terms of the wound he does have roughly 2 cm wound to his right shin with some slight surrounding erythema.  I would have concerns for cellulitis.  Patient was given dose of IV antibiotics here.  BNP within normal limits here.  Per chart review patient has history of lymphedema.  Did give patient Lasix  and he stated that it felt like it had helped.  Chest x-ray without any obviously concerning findings.  They did raise question of infection however at this time patient lacks other findings concerning for pneumonia.  I think more likely atelectasis.  Given that patient feels improvement I think is reasonable for patient be discharged.  Also  recommended patient talk to his primary care about obtaining a sleep study.   FINAL CLINICAL IMPRESSION(S) / ED DIAGNOSES   Final diagnoses:  Peripheral edema  Cellulitis, unspecified cellulitis site       Note:  This document was prepared using Dragon voice recognition software and may include unintentional dictation errors.    Floy Roberts, MD 12/06/24 [phone]

## 2024-12-06 NOTE — ED Notes (Signed)
 Elevated patient legs with pillows per his request.

## 2024-12-22 ENCOUNTER — Encounter: Admitting: Dermatology

## 2025-01-12 ENCOUNTER — Telehealth: Payer: Self-pay

## 2025-01-12 ENCOUNTER — Encounter: Admitting: Dermatology

## 2025-01-12 NOTE — Telephone Encounter (Signed)
 Tried to call patient regarding Mohs referral to Dr. Corey he canceled and appointment follow up he canceled today. Patient need of treatment and scheduling.  Patient no showed appointment follow up today, has history of BCC at right lateral neck that needs treatment. Called and spoke to patient on 08/17/2024 and scheduled with follow up with Dr. Hester and referral to Dr. Corey was sent.   Patient has canceled both appointments.

## 2025-01-26 ENCOUNTER — Encounter: Admitting: Physician Assistant
